# Patient Record
Sex: Male | Born: 1935 | Race: White | Hispanic: No | Marital: Married | Smoking: Former smoker
Health system: Southern US, Community
[De-identification: ages and names within clinical notes are randomized; demographics above are authoritative.]

## PROBLEM LIST (undated history)

## (undated) DIAGNOSIS — H409 Unspecified glaucoma: Secondary | ICD-10-CM

## (undated) DIAGNOSIS — H269 Unspecified cataract: Secondary | ICD-10-CM

## (undated) DIAGNOSIS — E785 Hyperlipidemia, unspecified: Secondary | ICD-10-CM

## (undated) DIAGNOSIS — E039 Hypothyroidism, unspecified: Secondary | ICD-10-CM

## (undated) DIAGNOSIS — I10 Essential (primary) hypertension: Secondary | ICD-10-CM

## (undated) DIAGNOSIS — H53009 Unspecified amblyopia, unspecified eye: Secondary | ICD-10-CM

## (undated) DIAGNOSIS — Z8673 Personal history of transient ischemic attack (TIA), and cerebral infarction without residual deficits: Secondary | ICD-10-CM

## (undated) DIAGNOSIS — G459 Transient cerebral ischemic attack, unspecified: Secondary | ICD-10-CM

## (undated) DIAGNOSIS — E079 Disorder of thyroid, unspecified: Secondary | ICD-10-CM

## (undated) DIAGNOSIS — J45909 Unspecified asthma, uncomplicated: Secondary | ICD-10-CM

## (undated) DIAGNOSIS — J449 Chronic obstructive pulmonary disease, unspecified: Secondary | ICD-10-CM

## (undated) DIAGNOSIS — H34231 Retinal artery branch occlusion, right eye: Secondary | ICD-10-CM

## (undated) DIAGNOSIS — G629 Polyneuropathy, unspecified: Secondary | ICD-10-CM

## (undated) DIAGNOSIS — E119 Type 2 diabetes mellitus without complications: Secondary | ICD-10-CM

## (undated) DIAGNOSIS — N429 Disorder of prostate, unspecified: Secondary | ICD-10-CM

## (undated) HISTORY — DX: Transient cerebral ischemic attack, unspecified: G45.9

## (undated) HISTORY — DX: Retinal artery branch occlusion, right eye: H34.231

## (undated) HISTORY — PX: TONSILLECTOMY: SUR1361

## (undated) HISTORY — PX: UMBILICAL HERNIA REPAIR: SHX196

## (undated) HISTORY — PX: HERNIA REPAIR: SHX51

## (undated) HISTORY — DX: Unspecified glaucoma: H40.9

## (undated) HISTORY — DX: Unspecified cataract: H26.9

## (undated) HISTORY — DX: Hyperlipidemia, unspecified: E78.5

## (undated) HISTORY — DX: Disorder of thyroid, unspecified: E07.9

## (undated) HISTORY — DX: Polyneuropathy, unspecified: G62.9

## (undated) HISTORY — DX: Unspecified asthma, uncomplicated: J45.909

## (undated) HISTORY — PX: PAROTIDECTOMY: SUR1003

## (undated) HISTORY — DX: Chronic obstructive pulmonary disease, unspecified: J44.9

## (undated) HISTORY — DX: Personal history of transient ischemic attack (TIA), and cerebral infarction without residual deficits: Z86.73

## (undated) HISTORY — DX: Disorder of prostate, unspecified: N42.9

## (undated) HISTORY — DX: Unspecified amblyopia, unspecified eye: H53.009

---

## 2002-09-11 HISTORY — PX: COLONOSCOPY: SHX174

## 2015-11-22 DIAGNOSIS — E119 Type 2 diabetes mellitus without complications: Secondary | ICD-10-CM | POA: Diagnosis not present

## 2015-11-22 DIAGNOSIS — H401132 Primary open-angle glaucoma, bilateral, moderate stage: Secondary | ICD-10-CM | POA: Diagnosis not present

## 2015-11-22 DIAGNOSIS — H2513 Age-related nuclear cataract, bilateral: Secondary | ICD-10-CM | POA: Diagnosis not present

## 2015-11-22 DIAGNOSIS — H34231 Retinal artery branch occlusion, right eye: Secondary | ICD-10-CM | POA: Diagnosis not present

## 2015-12-02 DIAGNOSIS — E785 Hyperlipidemia, unspecified: Secondary | ICD-10-CM | POA: Diagnosis not present

## 2015-12-02 DIAGNOSIS — I10 Essential (primary) hypertension: Secondary | ICD-10-CM | POA: Diagnosis not present

## 2015-12-02 DIAGNOSIS — J449 Chronic obstructive pulmonary disease, unspecified: Secondary | ICD-10-CM | POA: Diagnosis not present

## 2015-12-02 DIAGNOSIS — E039 Hypothyroidism, unspecified: Secondary | ICD-10-CM | POA: Diagnosis not present

## 2015-12-02 DIAGNOSIS — E1149 Type 2 diabetes mellitus with other diabetic neurological complication: Secondary | ICD-10-CM | POA: Diagnosis not present

## 2016-01-21 DIAGNOSIS — K921 Melena: Secondary | ICD-10-CM | POA: Diagnosis not present

## 2016-01-21 DIAGNOSIS — Z7984 Long term (current) use of oral hypoglycemic drugs: Secondary | ICD-10-CM | POA: Diagnosis not present

## 2016-01-21 DIAGNOSIS — J45909 Unspecified asthma, uncomplicated: Secondary | ICD-10-CM | POA: Diagnosis not present

## 2016-01-21 DIAGNOSIS — E119 Type 2 diabetes mellitus without complications: Secondary | ICD-10-CM | POA: Diagnosis not present

## 2016-01-21 DIAGNOSIS — R404 Transient alteration of awareness: Secondary | ICD-10-CM | POA: Diagnosis not present

## 2016-01-21 DIAGNOSIS — R55 Syncope and collapse: Secondary | ICD-10-CM | POA: Diagnosis not present

## 2016-01-21 DIAGNOSIS — R42 Dizziness and giddiness: Secondary | ICD-10-CM | POA: Diagnosis not present

## 2016-01-21 DIAGNOSIS — R197 Diarrhea, unspecified: Secondary | ICD-10-CM | POA: Diagnosis not present

## 2016-01-21 DIAGNOSIS — Z87891 Personal history of nicotine dependence: Secondary | ICD-10-CM | POA: Diagnosis not present

## 2016-01-21 DIAGNOSIS — Z86010 Personal history of colonic polyps: Secondary | ICD-10-CM | POA: Diagnosis not present

## 2016-01-21 DIAGNOSIS — K625 Hemorrhage of anus and rectum: Secondary | ICD-10-CM | POA: Diagnosis not present

## 2016-01-21 DIAGNOSIS — Z7902 Long term (current) use of antithrombotics/antiplatelets: Secondary | ICD-10-CM | POA: Diagnosis not present

## 2016-01-21 DIAGNOSIS — E1142 Type 2 diabetes mellitus with diabetic polyneuropathy: Secondary | ICD-10-CM | POA: Diagnosis not present

## 2016-01-21 DIAGNOSIS — D72829 Elevated white blood cell count, unspecified: Secondary | ICD-10-CM | POA: Diagnosis not present

## 2016-01-21 DIAGNOSIS — K5731 Diverticulosis of large intestine without perforation or abscess with bleeding: Secondary | ICD-10-CM | POA: Diagnosis not present

## 2016-01-21 DIAGNOSIS — I10 Essential (primary) hypertension: Secondary | ICD-10-CM | POA: Diagnosis not present

## 2016-01-21 DIAGNOSIS — D649 Anemia, unspecified: Secondary | ICD-10-CM | POA: Diagnosis not present

## 2016-01-21 DIAGNOSIS — Z8673 Personal history of transient ischemic attack (TIA), and cerebral infarction without residual deficits: Secondary | ICD-10-CM | POA: Diagnosis not present

## 2016-01-21 DIAGNOSIS — K222 Esophageal obstruction: Secondary | ICD-10-CM | POA: Diagnosis not present

## 2016-01-21 DIAGNOSIS — R531 Weakness: Secondary | ICD-10-CM | POA: Diagnosis not present

## 2016-01-21 DIAGNOSIS — Z79899 Other long term (current) drug therapy: Secondary | ICD-10-CM | POA: Diagnosis not present

## 2016-01-22 DIAGNOSIS — K921 Melena: Secondary | ICD-10-CM | POA: Diagnosis not present

## 2016-01-22 DIAGNOSIS — D123 Benign neoplasm of transverse colon: Secondary | ICD-10-CM | POA: Diagnosis not present

## 2016-01-22 DIAGNOSIS — D120 Benign neoplasm of cecum: Secondary | ICD-10-CM | POA: Diagnosis not present

## 2016-01-22 DIAGNOSIS — K222 Esophageal obstruction: Secondary | ICD-10-CM | POA: Diagnosis not present

## 2016-01-22 DIAGNOSIS — D122 Benign neoplasm of ascending colon: Secondary | ICD-10-CM | POA: Diagnosis not present

## 2016-01-22 DIAGNOSIS — K625 Hemorrhage of anus and rectum: Secondary | ICD-10-CM | POA: Diagnosis not present

## 2016-01-22 DIAGNOSIS — R55 Syncope and collapse: Secondary | ICD-10-CM | POA: Diagnosis not present

## 2016-01-22 DIAGNOSIS — R42 Dizziness and giddiness: Secondary | ICD-10-CM | POA: Diagnosis not present

## 2016-01-22 HISTORY — PX: COLONOSCOPY W/ BIOPSIES: SHX1374

## 2016-01-22 HISTORY — PX: UPPER GASTROINTESTINAL ENDOSCOPY: SHX188

## 2016-01-23 DIAGNOSIS — R42 Dizziness and giddiness: Secondary | ICD-10-CM | POA: Diagnosis not present

## 2016-01-23 DIAGNOSIS — K625 Hemorrhage of anus and rectum: Secondary | ICD-10-CM | POA: Diagnosis not present

## 2016-01-23 DIAGNOSIS — R55 Syncope and collapse: Secondary | ICD-10-CM | POA: Diagnosis not present

## 2016-02-02 DIAGNOSIS — Z09 Encounter for follow-up examination after completed treatment for conditions other than malignant neoplasm: Secondary | ICD-10-CM | POA: Diagnosis not present

## 2016-02-02 DIAGNOSIS — K625 Hemorrhage of anus and rectum: Secondary | ICD-10-CM | POA: Diagnosis not present

## 2016-02-09 DIAGNOSIS — Z8719 Personal history of other diseases of the digestive system: Secondary | ICD-10-CM | POA: Diagnosis not present

## 2016-02-09 DIAGNOSIS — Z86010 Personal history of colonic polyps: Secondary | ICD-10-CM | POA: Diagnosis not present

## 2016-03-07 DIAGNOSIS — E039 Hypothyroidism, unspecified: Secondary | ICD-10-CM | POA: Diagnosis not present

## 2016-03-07 DIAGNOSIS — I10 Essential (primary) hypertension: Secondary | ICD-10-CM | POA: Diagnosis not present

## 2016-03-07 DIAGNOSIS — E785 Hyperlipidemia, unspecified: Secondary | ICD-10-CM | POA: Diagnosis not present

## 2016-03-07 DIAGNOSIS — E1142 Type 2 diabetes mellitus with diabetic polyneuropathy: Secondary | ICD-10-CM | POA: Diagnosis not present

## 2016-03-30 DIAGNOSIS — H401132 Primary open-angle glaucoma, bilateral, moderate stage: Secondary | ICD-10-CM | POA: Diagnosis not present

## 2016-05-08 DIAGNOSIS — J40 Bronchitis, not specified as acute or chronic: Secondary | ICD-10-CM | POA: Diagnosis not present

## 2016-05-17 DIAGNOSIS — R05 Cough: Secondary | ICD-10-CM | POA: Diagnosis not present

## 2016-05-17 DIAGNOSIS — J42 Unspecified chronic bronchitis: Secondary | ICD-10-CM | POA: Diagnosis not present

## 2016-05-17 DIAGNOSIS — J9801 Acute bronchospasm: Secondary | ICD-10-CM | POA: Diagnosis not present

## 2016-05-17 DIAGNOSIS — J449 Chronic obstructive pulmonary disease, unspecified: Secondary | ICD-10-CM | POA: Diagnosis not present

## 2016-05-30 DIAGNOSIS — J449 Chronic obstructive pulmonary disease, unspecified: Secondary | ICD-10-CM | POA: Diagnosis not present

## 2016-05-30 DIAGNOSIS — Z23 Encounter for immunization: Secondary | ICD-10-CM | POA: Diagnosis not present

## 2016-06-06 DIAGNOSIS — S161XXA Strain of muscle, fascia and tendon at neck level, initial encounter: Secondary | ICD-10-CM | POA: Diagnosis not present

## 2016-08-10 DIAGNOSIS — H25012 Cortical age-related cataract, left eye: Secondary | ICD-10-CM | POA: Diagnosis not present

## 2016-08-10 DIAGNOSIS — H524 Presbyopia: Secondary | ICD-10-CM | POA: Diagnosis not present

## 2016-08-10 DIAGNOSIS — H5203 Hypermetropia, bilateral: Secondary | ICD-10-CM | POA: Diagnosis not present

## 2016-08-10 DIAGNOSIS — E119 Type 2 diabetes mellitus without complications: Secondary | ICD-10-CM | POA: Diagnosis not present

## 2016-08-10 DIAGNOSIS — H2513 Age-related nuclear cataract, bilateral: Secondary | ICD-10-CM | POA: Diagnosis not present

## 2016-08-10 DIAGNOSIS — H401132 Primary open-angle glaucoma, bilateral, moderate stage: Secondary | ICD-10-CM | POA: Diagnosis not present

## 2016-08-10 DIAGNOSIS — H52203 Unspecified astigmatism, bilateral: Secondary | ICD-10-CM | POA: Diagnosis not present

## 2016-08-29 DIAGNOSIS — E039 Hypothyroidism, unspecified: Secondary | ICD-10-CM | POA: Diagnosis not present

## 2016-08-29 DIAGNOSIS — I10 Essential (primary) hypertension: Secondary | ICD-10-CM | POA: Diagnosis not present

## 2016-08-29 DIAGNOSIS — R351 Nocturia: Secondary | ICD-10-CM | POA: Diagnosis not present

## 2016-08-29 DIAGNOSIS — N401 Enlarged prostate with lower urinary tract symptoms: Secondary | ICD-10-CM | POA: Diagnosis not present

## 2016-08-29 DIAGNOSIS — E1142 Type 2 diabetes mellitus with diabetic polyneuropathy: Secondary | ICD-10-CM | POA: Diagnosis not present

## 2016-08-29 DIAGNOSIS — E785 Hyperlipidemia, unspecified: Secondary | ICD-10-CM | POA: Diagnosis not present

## 2016-10-31 DIAGNOSIS — E785 Hyperlipidemia, unspecified: Secondary | ICD-10-CM | POA: Diagnosis not present

## 2016-12-06 DIAGNOSIS — E1142 Type 2 diabetes mellitus with diabetic polyneuropathy: Secondary | ICD-10-CM | POA: Diagnosis not present

## 2016-12-06 DIAGNOSIS — E039 Hypothyroidism, unspecified: Secondary | ICD-10-CM | POA: Diagnosis not present

## 2016-12-06 DIAGNOSIS — I10 Essential (primary) hypertension: Secondary | ICD-10-CM | POA: Diagnosis not present

## 2016-12-06 DIAGNOSIS — E785 Hyperlipidemia, unspecified: Secondary | ICD-10-CM | POA: Diagnosis not present

## 2017-01-08 DIAGNOSIS — H401132 Primary open-angle glaucoma, bilateral, moderate stage: Secondary | ICD-10-CM | POA: Diagnosis not present

## 2017-03-07 DIAGNOSIS — R42 Dizziness and giddiness: Secondary | ICD-10-CM | POA: Diagnosis not present

## 2017-03-12 DIAGNOSIS — I10 Essential (primary) hypertension: Secondary | ICD-10-CM | POA: Diagnosis not present

## 2017-03-12 DIAGNOSIS — E785 Hyperlipidemia, unspecified: Secondary | ICD-10-CM | POA: Diagnosis not present

## 2017-03-12 DIAGNOSIS — M2040 Other hammer toe(s) (acquired), unspecified foot: Secondary | ICD-10-CM | POA: Diagnosis not present

## 2017-03-12 DIAGNOSIS — E039 Hypothyroidism, unspecified: Secondary | ICD-10-CM | POA: Diagnosis not present

## 2017-03-12 DIAGNOSIS — E1142 Type 2 diabetes mellitus with diabetic polyneuropathy: Secondary | ICD-10-CM | POA: Diagnosis not present

## 2017-04-09 DIAGNOSIS — Z1283 Encounter for screening for malignant neoplasm of skin: Secondary | ICD-10-CM | POA: Diagnosis not present

## 2017-04-09 DIAGNOSIS — L820 Inflamed seborrheic keratosis: Secondary | ICD-10-CM | POA: Diagnosis not present

## 2017-04-09 DIAGNOSIS — L821 Other seborrheic keratosis: Secondary | ICD-10-CM | POA: Diagnosis not present

## 2017-06-21 DIAGNOSIS — E039 Hypothyroidism, unspecified: Secondary | ICD-10-CM | POA: Diagnosis not present

## 2017-06-21 DIAGNOSIS — E785 Hyperlipidemia, unspecified: Secondary | ICD-10-CM | POA: Diagnosis not present

## 2017-06-21 DIAGNOSIS — I10 Essential (primary) hypertension: Secondary | ICD-10-CM | POA: Diagnosis not present

## 2017-06-21 DIAGNOSIS — Z23 Encounter for immunization: Secondary | ICD-10-CM | POA: Diagnosis not present

## 2017-06-21 DIAGNOSIS — E119 Type 2 diabetes mellitus without complications: Secondary | ICD-10-CM | POA: Diagnosis not present

## 2017-07-10 DIAGNOSIS — E119 Type 2 diabetes mellitus without complications: Secondary | ICD-10-CM | POA: Diagnosis not present

## 2017-07-10 DIAGNOSIS — H401132 Primary open-angle glaucoma, bilateral, moderate stage: Secondary | ICD-10-CM | POA: Diagnosis not present

## 2017-07-10 DIAGNOSIS — Z7984 Long term (current) use of oral hypoglycemic drugs: Secondary | ICD-10-CM | POA: Diagnosis not present

## 2017-07-10 DIAGNOSIS — H2513 Age-related nuclear cataract, bilateral: Secondary | ICD-10-CM | POA: Diagnosis not present

## 2017-09-24 DIAGNOSIS — E785 Hyperlipidemia, unspecified: Secondary | ICD-10-CM | POA: Diagnosis not present

## 2017-09-24 DIAGNOSIS — E039 Hypothyroidism, unspecified: Secondary | ICD-10-CM | POA: Diagnosis not present

## 2017-09-24 DIAGNOSIS — E119 Type 2 diabetes mellitus without complications: Secondary | ICD-10-CM | POA: Diagnosis not present

## 2017-09-24 DIAGNOSIS — I10 Essential (primary) hypertension: Secondary | ICD-10-CM | POA: Diagnosis not present

## 2017-10-11 ENCOUNTER — Emergency Department (HOSPITAL_COMMUNITY): Payer: PPO

## 2017-10-11 ENCOUNTER — Other Ambulatory Visit: Payer: Self-pay

## 2017-10-11 ENCOUNTER — Inpatient Hospital Stay (HOSPITAL_COMMUNITY)
Admission: EM | Admit: 2017-10-11 | Discharge: 2017-10-15 | DRG: 470 | Disposition: A | Discharge status: Skilled Nursing Facility | Payer: PPO | Attending: Family Medicine | Admitting: Family Medicine

## 2017-10-11 DIAGNOSIS — Z96641 Presence of right artificial hip joint: Secondary | ICD-10-CM | POA: Diagnosis not present

## 2017-10-11 DIAGNOSIS — Z96642 Presence of left artificial hip joint: Secondary | ICD-10-CM | POA: Diagnosis present

## 2017-10-11 DIAGNOSIS — R410 Disorientation, unspecified: Secondary | ICD-10-CM | POA: Diagnosis not present

## 2017-10-11 DIAGNOSIS — S7001XA Contusion of right hip, initial encounter: Secondary | ICD-10-CM | POA: Diagnosis present

## 2017-10-11 DIAGNOSIS — M25551 Pain in right hip: Secondary | ICD-10-CM | POA: Diagnosis not present

## 2017-10-11 DIAGNOSIS — M6281 Muscle weakness (generalized): Secondary | ICD-10-CM | POA: Diagnosis not present

## 2017-10-11 DIAGNOSIS — Z833 Family history of diabetes mellitus: Secondary | ICD-10-CM | POA: Diagnosis not present

## 2017-10-11 DIAGNOSIS — S299XXA Unspecified injury of thorax, initial encounter: Secondary | ICD-10-CM | POA: Diagnosis not present

## 2017-10-11 DIAGNOSIS — E119 Type 2 diabetes mellitus without complications: Secondary | ICD-10-CM | POA: Diagnosis present

## 2017-10-11 DIAGNOSIS — Z7984 Long term (current) use of oral hypoglycemic drugs: Secondary | ICD-10-CM | POA: Diagnosis not present

## 2017-10-11 DIAGNOSIS — Z6830 Body mass index (BMI) 30.0-30.9, adult: Secondary | ICD-10-CM

## 2017-10-11 DIAGNOSIS — E1169 Type 2 diabetes mellitus with other specified complication: Secondary | ICD-10-CM | POA: Diagnosis not present

## 2017-10-11 DIAGNOSIS — D62 Acute posthemorrhagic anemia: Secondary | ICD-10-CM | POA: Diagnosis not present

## 2017-10-11 DIAGNOSIS — J449 Chronic obstructive pulmonary disease, unspecified: Secondary | ICD-10-CM | POA: Diagnosis present

## 2017-10-11 DIAGNOSIS — D72829 Elevated white blood cell count, unspecified: Secondary | ICD-10-CM | POA: Diagnosis present

## 2017-10-11 DIAGNOSIS — I444 Left anterior fascicular block: Secondary | ICD-10-CM | POA: Diagnosis present

## 2017-10-11 DIAGNOSIS — W109XXA Fall (on) (from) unspecified stairs and steps, initial encounter: Secondary | ICD-10-CM | POA: Diagnosis present

## 2017-10-11 DIAGNOSIS — Z79890 Hormone replacement therapy (postmenopausal): Secondary | ICD-10-CM | POA: Diagnosis not present

## 2017-10-11 DIAGNOSIS — S72001D Fracture of unspecified part of neck of right femur, subsequent encounter for closed fracture with routine healing: Secondary | ICD-10-CM | POA: Diagnosis not present

## 2017-10-11 DIAGNOSIS — Z7902 Long term (current) use of antithrombotics/antiplatelets: Secondary | ICD-10-CM

## 2017-10-11 DIAGNOSIS — I10 Essential (primary) hypertension: Secondary | ICD-10-CM | POA: Diagnosis present

## 2017-10-11 DIAGNOSIS — S72091A Other fracture of head and neck of right femur, initial encounter for closed fracture: Secondary | ICD-10-CM | POA: Diagnosis not present

## 2017-10-11 DIAGNOSIS — E785 Hyperlipidemia, unspecified: Secondary | ICD-10-CM | POA: Diagnosis present

## 2017-10-11 DIAGNOSIS — S72041A Displaced fracture of base of neck of right femur, initial encounter for closed fracture: Secondary | ICD-10-CM | POA: Diagnosis not present

## 2017-10-11 DIAGNOSIS — S72001A Fracture of unspecified part of neck of right femur, initial encounter for closed fracture: Secondary | ICD-10-CM | POA: Diagnosis present

## 2017-10-11 DIAGNOSIS — T148XXA Other injury of unspecified body region, initial encounter: Secondary | ICD-10-CM | POA: Diagnosis not present

## 2017-10-11 DIAGNOSIS — R52 Pain, unspecified: Secondary | ICD-10-CM

## 2017-10-11 DIAGNOSIS — Z8249 Family history of ischemic heart disease and other diseases of the circulatory system: Secondary | ICD-10-CM | POA: Diagnosis not present

## 2017-10-11 DIAGNOSIS — Z7951 Long term (current) use of inhaled steroids: Secondary | ICD-10-CM

## 2017-10-11 DIAGNOSIS — Z79899 Other long term (current) drug therapy: Secondary | ICD-10-CM

## 2017-10-11 DIAGNOSIS — Z9181 History of falling: Secondary | ICD-10-CM | POA: Diagnosis not present

## 2017-10-11 DIAGNOSIS — E669 Obesity, unspecified: Secondary | ICD-10-CM | POA: Diagnosis present

## 2017-10-11 DIAGNOSIS — E039 Hypothyroidism, unspecified: Secondary | ICD-10-CM | POA: Diagnosis present

## 2017-10-11 DIAGNOSIS — E7800 Pure hypercholesterolemia, unspecified: Secondary | ICD-10-CM | POA: Diagnosis present

## 2017-10-11 DIAGNOSIS — Z471 Aftercare following joint replacement surgery: Secondary | ICD-10-CM | POA: Diagnosis not present

## 2017-10-11 DIAGNOSIS — S79911A Unspecified injury of right hip, initial encounter: Secondary | ICD-10-CM | POA: Diagnosis not present

## 2017-10-11 DIAGNOSIS — Y92511 Restaurant or cafe as the place of occurrence of the external cause: Secondary | ICD-10-CM

## 2017-10-11 DIAGNOSIS — G8911 Acute pain due to trauma: Secondary | ICD-10-CM | POA: Diagnosis not present

## 2017-10-11 DIAGNOSIS — R488 Other symbolic dysfunctions: Secondary | ICD-10-CM | POA: Diagnosis not present

## 2017-10-11 DIAGNOSIS — R262 Difficulty in walking, not elsewhere classified: Secondary | ICD-10-CM | POA: Diagnosis not present

## 2017-10-11 DIAGNOSIS — S79919A Unspecified injury of unspecified hip, initial encounter: Secondary | ICD-10-CM | POA: Diagnosis not present

## 2017-10-11 HISTORY — DX: Essential (primary) hypertension: I10

## 2017-10-11 HISTORY — DX: Type 2 diabetes mellitus without complications: E11.9

## 2017-10-11 HISTORY — DX: Hypothyroidism, unspecified: E03.9

## 2017-10-11 LAB — CBC WITH DIFFERENTIAL/PLATELET
BASOS ABS: 0 10*3/uL (ref 0.0–0.1)
Basophils Relative: 0 %
Eosinophils Absolute: 0.1 10*3/uL (ref 0.0–0.7)
Eosinophils Relative: 0 %
HEMATOCRIT: 43.6 % (ref 39.0–52.0)
Hemoglobin: 15.1 g/dL (ref 13.0–17.0)
LYMPHS ABS: 0.7 10*3/uL (ref 0.7–4.0)
LYMPHS PCT: 5 %
MCH: 33 pg (ref 26.0–34.0)
MCHC: 34.6 g/dL (ref 30.0–36.0)
MCV: 95.4 fL (ref 78.0–100.0)
MONO ABS: 1.4 10*3/uL — AB (ref 0.1–1.0)
Monocytes Relative: 9 %
NEUTROS ABS: 14 10*3/uL — AB (ref 1.7–7.7)
Neutrophils Relative %: 86 %
Platelets: 242 10*3/uL (ref 150–400)
RBC: 4.57 MIL/uL (ref 4.22–5.81)
RDW: 13.2 % (ref 11.5–15.5)
WBC: 16.2 10*3/uL — ABNORMAL HIGH (ref 4.0–10.5)

## 2017-10-11 NOTE — ED Provider Notes (Signed)
[facility] Provider Note   CSN: [id number] Arrival date & time: 10/11/17  2145     History   Chief Complaint Chief Complaint  Patient presents with  . Fall  . Hip Pain    HPI Nathan Pennington is a 82 y.o. male with a hx of non-insulin-dependent diabetes, COPD, high cholesterol, hypertension, presents to the Emergency Department complaining of acute, persistent, progressively worsening right hip pain onset approximately 8 PM this evening.  Patient reports he was at a restaurant and missed one step falling forward.  He reports he caught himself with his hands but the rest of his weight landed on his right hip.  He reports he was able to get up with the assistance of a chair and has ambulated since that time however the pain in his right hip has worsened significantly over the last several hours.  He states by the time he arrived home, he was unable to ambulate on his right leg any longer.  He denies weakness or numbness in the leg.  He reports no pain at rest however any flexion, extension or rotation creates severe 10/10 pain.  He denies any previous orthopedic procedures does not currently see an orthopedist.  Patient does report he is taking Plavix but does not know why.  No treatments prior to arrival.  Reports he did not hit his head or have a loss of consciousness.  He has no back pain.  The history is provided by the patient and medical records. No language interpreter was used.    Past Medical History:  Diagnosis Date  . Diabetes mellitus without complication ([place])   . Hypertension   . Hypothyroidism     Patient Active Problem List   Diagnosis Date Noted  . Closed right hip fracture, initial encounter ([place]) 10/12/2017  . Diabetes mellitus type 2 in obese ([place]) 10/12/2017  . Hypothyroidism 10/12/2017  . Essential hypertension 10/12/2017  . HLD (hyperlipidemia) 10/12/2017     Home Medications    Prior to Admission medications     Medication Sig Start Date End Date Taking? Authorizing Provider  albuterol (PROVENTIL HFA) 108 (90 Base) MCG/ACT inhaler Inhale 2 puffs into the lungs every 6 (six) hours as needed for shortness of breath. 05/17/16  Yes [provider]  ALPHAGAN P 0.1 % SOLN Place 1 drop into both eyes daily. 09/06/17  Yes [provider]  budesonide-formoterol (SYMBICORT) 160-4.5 MCG/ACT inhaler Inhale 2 puffs into the lungs 2 (two) times daily. 04/02/17 06/28/18 Yes [provider]  clopidogrel (PLAVIX) 75 MG tablet Take 75 mg by mouth daily. 10/05/15  Yes [provider]  fenofibrate (TRICOR) 48 MG tablet Take 48 mg by mouth daily. 09/26/17  Yes [provider]  finasteride (PROSCAR) 5 MG tablet Take 5 mg by mouth daily. 10/06/17  Yes [provider]  Flaxseed, Linseed, (FLAX SEEDS PO) Take 1 capsule by mouth daily.   Yes [provider]  glipiZIDE (GLUCOTROL XL) 2.5 MG 24 hr tablet Take 2.5 mg by mouth daily. 08/24/15  Yes [provider]  latanoprost (XALATAN) 0.005 % ophthalmic solution Place 1 drop into both eyes at bedtime.   Yes [provider]  levothyroxine (SYNTHROID, LEVOTHROID) 75 MCG tablet Take 75 mcg by mouth daily. 10/31/16 10/31/17 Yes [provider]  lisinopril (PRINIVIL,ZESTRIL) 2.5 MG tablet Take 2.5 mg by mouth daily. 07/23/17  Yes [provider]  meclizine (ANTIVERT) 25 MG tablet Take 25 mg by mouth as  needed. Travel sickness 03/07/17  Yes [provider]  metFORMIN (GLUCOPHAGE) 1000 MG tablet Take 1,000 mg by mouth 2 (two) times daily. 08/20/17  Yes [provider]  omega-3 acid ethyl esters (LOVAZA) 1 g capsule Take 1 g by mouth 2 (two) times daily.   Yes [provider]  OVER THE COUNTER MEDICATION Take 1 capsule by mouth daily.   Yes [provider]  OVER THE COUNTER MEDICATION Take 1 capsule by mouth at bedtime.   Yes [provider]  OVER THE COUNTER  MEDICATION Take 1 Scoop by mouth daily.   Yes [provider]  rosuvastatin (CRESTOR) 20 MG tablet Take 20 mg by mouth daily. 09/03/17  Yes [provider]  tamsulosin (FLOMAX) 0.4 MG CAPS capsule Take 0.4 mg by mouth daily. 09/26/17  Yes [provider]    Family History Family History  Problem Relation Age of Onset  . Cancer Mother   . Diabetes Mellitus II Father   . Hypertension Father     Social History Social History   Tobacco Use  . Smoking status: Never Smoker  . Smokeless tobacco: Never Used  Substance Use Topics  . Alcohol use: No    Frequency: Never  . Drug use: No     Allergies   Patient has no known allergies.   Review of Systems Review of Systems  Constitutional: Negative for appetite change, diaphoresis, fatigue, fever and unexpected weight change.  HENT: Negative for mouth sores.   Eyes: Negative for visual disturbance.  Respiratory: Negative for cough, chest tightness, shortness of breath and wheezing.   Cardiovascular: Negative for chest pain.  Gastrointestinal: Negative for abdominal pain, constipation, diarrhea, nausea and vomiting.  Endocrine: Negative for polydipsia, polyphagia and polyuria.  Genitourinary: Negative for dysuria, frequency, hematuria and urgency.  Musculoskeletal: Positive for arthralgias. Negative for back pain and neck stiffness.  Skin: Negative for rash.  Allergic/Immunologic: Negative for immunocompromised state.  Neurological: Negative for syncope, light-headedness and headaches.  Hematological: Does not bruise/bleed easily.  Psychiatric/Behavioral: Negative for sleep disturbance. The patient is not nervous/anxious.      Physical Exam Updated Vital Signs BP (!) 152/64 (BP Location: Right Arm)   Pulse 91   Temp 97.6 F (36.4 C) (Oral)   Resp 16   Ht 6' (1.829 m)   Wt 103 kg (227 lb)   SpO2 98%   BMI 30.79 kg/m   Physical Exam  Constitutional: He appears well-developed and well-nourished.  No distress.  Awake, alert, nontoxic appearance  HENT:  Head: Normocephalic and atraumatic.  Mouth/Throat: Oropharynx is clear and moist. No oropharyngeal exudate.  Eyes: Conjunctivae are normal. No scleral icterus.  Neck: Normal range of motion. Neck supple.  Cardiovascular: Normal rate, regular rhythm and intact distal pulses.  Pulmonary/Chest: Effort normal and breath sounds normal. No respiratory distress. He has no wheezes.  Equal chest expansion  Abdominal: Soft. Bowel sounds are normal. He exhibits no mass. There is no tenderness. There is no rebound and no guarding.  Musculoskeletal: He exhibits no edema.  Minimal tenderness to palpation along the right trochanter. No range of motion of the right hip or knee.  Full range of motion of the right ankle and all toes of the right foot. No significant bruising noted. No TTP along the groin  Neurological: He is alert.  Speech is clear and goal oriented Moves extremities without ataxia Sensation intact to normal touch in the bilateral lower extremities Strength 5/5 with dorsiflexion and plantarflexion in the bilateral  lower extremities; strength not tested at the right knee or hip  Skin: Skin is warm and dry. He is not diaphoretic.  Psychiatric: He has a normal mood and affect.  Nursing note and vitals reviewed.    ED Treatments / Results  Labs (all labs ordered are listed, but only abnormal results are displayed) Labs Reviewed  CBC WITH DIFFERENTIAL/PLATELET - Abnormal; Notable for the following components:      Result Value   WBC 16.2 (*)    Neutro Abs 14.0 (*)    Monocytes Absolute 1.4 (*)    All other components within normal limits  COMPREHENSIVE METABOLIC PANEL - Abnormal; Notable for the following components:   Glucose, Bld 169 (*)    BUN 22 (*)    ALT 13 (*)    All other components within normal limits  GLUCOSE, CAPILLARY - Abnormal; Notable for the following components:   Glucose-Capillary 157 (*)    All other  components within normal limits  SURGICAL PCR SCREEN  PROTIME-INR  TYPE AND SCREEN  ABO/RH    EKG  EKG Interpretation  Date/Time:  Thursday October 11 2017 23:24:34 EST Ventricular Rate:  83 PR Interval:    QRS Duration: 117 QT Interval:  392 QTC Calculation: 461 R Axis:   -59 Text Interpretation:  Sinus rhythm Left anterior fascicular block Probable left ventricular hypertrophy Confirmed by Quintella Reichert [phone]) on 10/11/2017 11:34:01 PM       Radiology Dg Chest 2 View  Result Date: 10/11/2017 CLINICAL DATA:  82 y/o  M; status post fall.  Preop. EXAM: CHEST  2 VIEW COMPARISON:  None. FINDINGS: Normal cardiac silhouette. Aortic atherosclerosis with calcification. Clear lungs. No pleural effusion or pneumothorax. No acute osseous abnormality is evident. IMPRESSION: No acute pulmonary process identified. Electronically Signed   By: Kristine Garbe M.D.   On: 10/11/2017 23:27   Dg Hip Unilat  With Pelvis 2-3 Views Right  Result Date: 10/11/2017 CLINICAL DATA:  Generalized right hip pain after a fall. EXAM: DG HIP (WITH OR WITHOUT PELVIS) 2-3V RIGHT COMPARISON:  None. FINDINGS: Transverse fracture of the right femoral neck with varus angulation of the fracture fragments. No dislocation at the hip joint. Pelvis appears intact. Mild degenerative changes in the lower lumbar spine and both hips. IMPRESSION: Acute transverse fracture of the right femoral neck with varus angulation. Electronically Signed   By: Lucienne Capers M.D.   On: 10/11/2017 22:36    Procedures Procedures (including critical care time)  Medications Ordered in ED Medications  brimonidine (ALPHAGAN) 0.15 % ophthalmic solution 1 drop (not administered)  mometasone-formoterol (DULERA) 200-5 MCG/ACT inhaler 2 puff (2 puffs Inhalation Not Given 10/12/17 0156)  latanoprost (XALATAN) 0.005 % ophthalmic solution 1 drop (not administered)  morphine 2 MG/ML injection 0.5 mg (0.5 mg Intravenous Given 10/12/17 0339)    insulin aspart (novoLOG) injection 0-9 Units (2 Units Subcutaneous Given 10/12/17 0436)  hydrALAZINE (APRESOLINE) injection 10 mg (not administered)  levothyroxine (SYNTHROID, LEVOTHROID) injection 37.5 mcg (not administered)  albuterol (PROVENTIL) (2.5 MG/3ML) 0.083% nebulizer solution 2.5 mg (not administered)     Initial Impression / Assessment and Plan / ED Course  I have reviewed the triage vital signs and the nursing notes.  Pertinent labs & imaging results that were available during my care of the patient were reviewed by me and considered in my medical decision making (see chart for details).  Clinical Course as of Oct 13 611  Thu Oct 11, 2017  2356 Discussed with Dr. Berenice Primas who  will evaluate in the AM.  Pt to be admitted to the hospitalist.  [HM]  Fri Oct 12, 2017  0002 Discussed with Dr. Hal Hope who will admit  [HM]  0612 Leukocytosis WBC: (!) 16.2 [HM]    Clinical Course User Index [HM] Stepan Verrette, Jarrett Soho, PA-C    Patient with right femoral neck fracture with some varus angulation.  I personally evaluated the films.  Neurovascularly intact.  Mechanical fall, no syncope.  Preop chest x-ray and EKG completed.  Leukocytosis noted however patient is afebrile without signs of infection.  Patient also with hypertension however no chest pain or shortness of breath.  History of same.  Patient will need admission.  He is on Plavix chronically.  Discussed with orthopedics who will evaluate in the morning.  Pt will be admitted to the hospitalist.  The patient was discussed with and seen by Dr. Ralene Bathe who agrees with the treatment plan.   Final Clinical Impressions(s) / ED Diagnoses   Final diagnoses:  Closed fracture of right hip, initial encounter [facility])    ED Discharge Orders    None       Loni Muse Gwenlyn Perking 10/12/17 8657    Quintella Reichert, MD 10/12/17 [phone]

## 2017-10-11 NOTE — ED Notes (Signed)
Patient transported to X-ray 

## 2017-10-11 NOTE — ED Notes (Signed)
Bed: [facility] Expected date:  Expected time:  Means of arrival:  Comments: Hip pain/possible rotation

## 2017-10-11 NOTE — ED Notes (Addendum)
Patient states he took 1g tylenol before paramedics arrived.

## 2017-10-11 NOTE — ED Triage Notes (Signed)
Pt BIB EMS from home with complaints of R hip pain s/p fall at a restaurant in [place]. Patient made it home and crawled up stairs, having to be carried down stairs. Fall was mechanical, Patient mis-stepped off of the seating area. Patient is on plavix.

## 2017-10-12 ENCOUNTER — Inpatient Hospital Stay (HOSPITAL_COMMUNITY): Payer: PPO

## 2017-10-12 ENCOUNTER — Inpatient Hospital Stay (HOSPITAL_COMMUNITY): Payer: PPO | Admitting: Certified Registered Nurse Anesthetist

## 2017-10-12 ENCOUNTER — Encounter (HOSPITAL_COMMUNITY)
Admission: EM | Disposition: A | Discharge status: Skilled Nursing Facility | Payer: Self-pay | Source: Home / Self Care | Attending: Nephrology

## 2017-10-12 ENCOUNTER — Encounter (HOSPITAL_COMMUNITY): Payer: Self-pay | Admitting: Internal Medicine

## 2017-10-12 DIAGNOSIS — E785 Hyperlipidemia, unspecified: Secondary | ICD-10-CM | POA: Diagnosis present

## 2017-10-12 DIAGNOSIS — S72001A Fracture of unspecified part of neck of right femur, initial encounter for closed fracture: Secondary | ICD-10-CM | POA: Diagnosis present

## 2017-10-12 DIAGNOSIS — Z7951 Long term (current) use of inhaled steroids: Secondary | ICD-10-CM | POA: Diagnosis not present

## 2017-10-12 DIAGNOSIS — E669 Obesity, unspecified: Secondary | ICD-10-CM | POA: Diagnosis present

## 2017-10-12 DIAGNOSIS — D62 Acute posthemorrhagic anemia: Secondary | ICD-10-CM | POA: Diagnosis not present

## 2017-10-12 DIAGNOSIS — Z79899 Other long term (current) drug therapy: Secondary | ICD-10-CM | POA: Diagnosis not present

## 2017-10-12 DIAGNOSIS — R410 Disorientation, unspecified: Secondary | ICD-10-CM | POA: Diagnosis not present

## 2017-10-12 DIAGNOSIS — E119 Type 2 diabetes mellitus without complications: Secondary | ICD-10-CM | POA: Diagnosis present

## 2017-10-12 DIAGNOSIS — E1169 Type 2 diabetes mellitus with other specified complication: Secondary | ICD-10-CM | POA: Diagnosis present

## 2017-10-12 DIAGNOSIS — Y92511 Restaurant or cafe as the place of occurrence of the external cause: Secondary | ICD-10-CM | POA: Diagnosis not present

## 2017-10-12 DIAGNOSIS — J449 Chronic obstructive pulmonary disease, unspecified: Secondary | ICD-10-CM | POA: Diagnosis present

## 2017-10-12 DIAGNOSIS — I10 Essential (primary) hypertension: Secondary | ICD-10-CM | POA: Diagnosis present

## 2017-10-12 DIAGNOSIS — Z833 Family history of diabetes mellitus: Secondary | ICD-10-CM | POA: Diagnosis not present

## 2017-10-12 DIAGNOSIS — D72829 Elevated white blood cell count, unspecified: Secondary | ICD-10-CM | POA: Diagnosis present

## 2017-10-12 DIAGNOSIS — E039 Hypothyroidism, unspecified: Secondary | ICD-10-CM | POA: Diagnosis present

## 2017-10-12 DIAGNOSIS — S7001XA Contusion of right hip, initial encounter: Secondary | ICD-10-CM | POA: Diagnosis present

## 2017-10-12 DIAGNOSIS — Z6830 Body mass index (BMI) 30.0-30.9, adult: Secondary | ICD-10-CM | POA: Diagnosis not present

## 2017-10-12 DIAGNOSIS — W109XXA Fall (on) (from) unspecified stairs and steps, initial encounter: Secondary | ICD-10-CM | POA: Diagnosis present

## 2017-10-12 DIAGNOSIS — Z7984 Long term (current) use of oral hypoglycemic drugs: Secondary | ICD-10-CM | POA: Diagnosis not present

## 2017-10-12 DIAGNOSIS — Z8249 Family history of ischemic heart disease and other diseases of the circulatory system: Secondary | ICD-10-CM | POA: Diagnosis not present

## 2017-10-12 DIAGNOSIS — Z7902 Long term (current) use of antithrombotics/antiplatelets: Secondary | ICD-10-CM | POA: Diagnosis not present

## 2017-10-12 DIAGNOSIS — I444 Left anterior fascicular block: Secondary | ICD-10-CM | POA: Diagnosis present

## 2017-10-12 DIAGNOSIS — E7800 Pure hypercholesterolemia, unspecified: Secondary | ICD-10-CM | POA: Diagnosis present

## 2017-10-12 DIAGNOSIS — Z79890 Hormone replacement therapy (postmenopausal): Secondary | ICD-10-CM | POA: Diagnosis not present

## 2017-10-12 DIAGNOSIS — Z96642 Presence of left artificial hip joint: Secondary | ICD-10-CM | POA: Diagnosis present

## 2017-10-12 HISTORY — PX: TOTAL HIP ARTHROPLASTY: SHX124

## 2017-10-12 LAB — COMPREHENSIVE METABOLIC PANEL
ALK PHOS: 53 U/L (ref 38–126)
ALT: 13 U/L — ABNORMAL LOW (ref 17–63)
ANION GAP: 7 (ref 5–15)
AST: 20 U/L (ref 15–41)
Albumin: 3.9 g/dL (ref 3.5–5.0)
BUN: 22 mg/dL — ABNORMAL HIGH (ref 6–20)
CALCIUM: 8.9 mg/dL (ref 8.9–10.3)
CO2: 26 mmol/L (ref 22–32)
Chloride: 108 mmol/L (ref 101–111)
Creatinine, Ser: 1.03 mg/dL (ref 0.61–1.24)
GFR calc non Af Amer: >60 mL/min (ref >60)
Glucose, Bld: 169 mg/dL — ABNORMAL HIGH (ref 65–99)
Potassium: 3.7 mmol/L (ref 3.5–5.1)
SODIUM: 141 mmol/L (ref 135–145)
Total Bilirubin: 0.7 mg/dL (ref 0.3–1.2)
Total Protein: 6.8 g/dL (ref 6.5–8.1)

## 2017-10-12 LAB — GLUCOSE, CAPILLARY
GLUCOSE-CAPILLARY: 157 mg/dL — AB (ref 65–99)
GLUCOSE-CAPILLARY: 134 mg/dL — AB (ref 65–99)
Glucose-Capillary: 114 mg/dL — ABNORMAL HIGH (ref 65–99)
Glucose-Capillary: 135 mg/dL — ABNORMAL HIGH (ref 65–99)
Glucose-Capillary: 179 mg/dL — ABNORMAL HIGH (ref 65–99)
Glucose-Capillary: 211 mg/dL — ABNORMAL HIGH (ref 65–99)

## 2017-10-12 LAB — SURGICAL PCR SCREEN
MRSA, PCR: NEGATIVE
Staphylococcus aureus: NEGATIVE

## 2017-10-12 LAB — ABO/RH: ABO/RH(D): O POS

## 2017-10-12 LAB — TYPE AND SCREEN
ABO/RH(D): O POS
Antibody Screen: NEGATIVE

## 2017-10-12 LAB — PROTIME-INR
INR: 0.9
Prothrombin Time: 12.1 seconds (ref 11.4–15.2)

## 2017-10-12 SURGERY — ARTHROPLASTY, HIP, TOTAL, ANTERIOR APPROACH
Anesthesia: General | Site: Hip | Laterality: Right

## 2017-10-12 MED ORDER — MORPHINE SULFATE (PF) 2 MG/ML IV SOLN
0.5000 mg | INTRAVENOUS | Status: DC | PRN
  Administered 2017-10-12 (×3): 0.5 mg via INTRAVENOUS
  Filled 2017-10-12 (×3): qty 1

## 2017-10-12 MED ORDER — BISACODYL 5 MG PO TBEC
5.0000 mg | DELAYED_RELEASE_TABLET | Freq: Every day | ORAL | Status: DC | PRN
  Administered 2017-10-13 – 2017-10-15 (×2): 5 mg via ORAL
  Filled 2017-10-12 (×2): qty 1

## 2017-10-12 MED ORDER — METHOCARBAMOL 1000 MG/10ML IJ SOLN
500.0000 mg | Freq: Four times a day (QID) | INTRAVENOUS | Status: DC | PRN
  Administered 2017-10-13: 500 mg via INTRAVENOUS
  Filled 2017-10-12: qty 550

## 2017-10-12 MED ORDER — SODIUM CHLORIDE 0.9 % IJ SOLN
INTRAMUSCULAR | Status: AC
  Filled 2017-10-12: qty 10

## 2017-10-12 MED ORDER — POVIDONE-IODINE 10 % EX SWAB: 2.0000 "application " | Freq: Once | CUTANEOUS | Status: DC

## 2017-10-12 MED ORDER — ACETAMINOPHEN 650 MG RE SUPP: 650.0000 mg | Freq: Four times a day (QID) | RECTAL | Status: DC | PRN

## 2017-10-12 MED ORDER — DOCUSATE SODIUM 100 MG PO CAPS: 100.0000 mg | ORAL_CAPSULE | Freq: Two times a day (BID) | ORAL | 0 refills | Status: AC

## 2017-10-12 MED ORDER — DEXAMETHASONE SODIUM PHOSPHATE 10 MG/ML IJ SOLN
INTRAMUSCULAR | Status: DC | PRN
  Administered 2017-10-12: 5 mg via INTRAVENOUS

## 2017-10-12 MED ORDER — ASPIRIN EC 325 MG PO TBEC: 325.0000 mg | DELAYED_RELEASE_TABLET | Freq: Every day | ORAL | 0 refills | Status: DC

## 2017-10-12 MED ORDER — ZOLPIDEM TARTRATE 5 MG PO TABS
5.0000 mg | ORAL_TABLET | Freq: Every evening | ORAL | Status: DC | PRN
Start: 2017-10-12 — End: 2017-10-15

## 2017-10-12 MED ORDER — DOCUSATE SODIUM 100 MG PO CAPS
100.0000 mg | ORAL_CAPSULE | Freq: Two times a day (BID) | ORAL | Status: DC
  Administered 2017-10-12 – 2017-10-15 (×6): 100 mg via ORAL
  Filled 2017-10-12 (×6): qty 1

## 2017-10-12 MED ORDER — POLYETHYLENE GLYCOL 3350 17 G PO PACK
17.0000 g | PACK | Freq: Every day | ORAL | Status: DC | PRN
  Filled 2017-10-12: qty 1

## 2017-10-12 MED ORDER — BUPIVACAINE LIPOSOME 1.3 % IJ SUSP
20.0000 mL | Freq: Once | INTRAMUSCULAR | Status: DC
  Filled 2017-10-12: qty 20

## 2017-10-12 MED ORDER — ALBUTEROL SULFATE HFA 108 (90 BASE) MCG/ACT IN AERS: 2.0000 | INHALATION_SPRAY | Freq: Four times a day (QID) | RESPIRATORY_TRACT | Status: DC | PRN

## 2017-10-12 MED ORDER — SUCCINYLCHOLINE CHLORIDE 200 MG/10ML IV SOSY
PREFILLED_SYRINGE | INTRAVENOUS | Status: AC
  Filled 2017-10-12: qty 10

## 2017-10-12 MED ORDER — LIDOCAINE 2% (20 MG/ML) 5 ML SYRINGE
INTRAMUSCULAR | Status: AC
  Filled 2017-10-12: qty 5

## 2017-10-12 MED ORDER — OXYCODONE HCL 5 MG/5ML PO SOLN: 5.0000 mg | Freq: Once | ORAL | Status: DC | PRN

## 2017-10-12 MED ORDER — LATANOPROST 0.005 % OP SOLN
1.0000 [drp] | Freq: Every day | OPHTHALMIC | Status: DC
  Administered 2017-10-12 – 2017-10-14 (×3): 1 [drp] via OPHTHALMIC
  Filled 2017-10-12 (×2): qty 2.5

## 2017-10-12 MED ORDER — PHENYLEPHRINE 40 MCG/ML (10ML) SYRINGE FOR IV PUSH (FOR BLOOD PRESSURE SUPPORT)
PREFILLED_SYRINGE | INTRAVENOUS | Status: AC
  Filled 2017-10-12: qty 20

## 2017-10-12 MED ORDER — ETOMIDATE 2 MG/ML IV SOLN
INTRAVENOUS | Status: AC
  Filled 2017-10-12: qty 10

## 2017-10-12 MED ORDER — BRIMONIDINE TARTRATE 0.15 % OP SOLN
1.0000 [drp] | Freq: Every day | OPHTHALMIC | Status: DC
  Administered 2017-10-12 – 2017-10-15 (×4): 1 [drp] via OPHTHALMIC
  Filled 2017-10-12: qty 5

## 2017-10-12 MED ORDER — HYDROCODONE-ACETAMINOPHEN 5-325 MG PO TABS
1.0000 | ORAL_TABLET | Freq: Four times a day (QID) | ORAL | Status: DC | PRN
  Administered 2017-10-13 – 2017-10-15 (×4): 1 via ORAL
  Filled 2017-10-12 (×2): qty 1
  Filled 2017-10-12: qty 2
  Filled 2017-10-12: qty 1

## 2017-10-12 MED ORDER — METHOCARBAMOL 500 MG PO TABS
500.0000 mg | ORAL_TABLET | Freq: Four times a day (QID) | ORAL | Status: DC | PRN
  Administered 2017-10-15: 02:00:00 500 mg via ORAL
  Filled 2017-10-12: qty 1

## 2017-10-12 MED ORDER — TRANEXAMIC ACID 1000 MG/10ML IV SOLN
1000.0000 mg | INTRAVENOUS | Status: AC
  Administered 2017-10-12: 1000 mg via INTRAVENOUS
  Filled 2017-10-12: qty 10
  Filled 2017-10-12: qty 1100

## 2017-10-12 MED ORDER — HYDROMORPHONE HCL 2 MG/ML IJ SOLN
INTRAMUSCULAR | Status: AC
  Filled 2017-10-12: qty 1

## 2017-10-12 MED ORDER — METFORMIN HCL 500 MG PO TABS
1000.0000 mg | ORAL_TABLET | Freq: Two times a day (BID) | ORAL | Status: DC
  Administered 2017-10-13: 08:00:00 1000 mg via ORAL
  Filled 2017-10-12: qty 2

## 2017-10-12 MED ORDER — LACTATED RINGERS IV SOLN
INTRAVENOUS | Status: DC
  Administered 2017-10-12 (×2): via INTRAVENOUS

## 2017-10-12 MED ORDER — LISINOPRIL 5 MG PO TABS
2.5000 mg | ORAL_TABLET | Freq: Every day | ORAL | Status: DC
  Administered 2017-10-15: 2.5 mg via ORAL
  Filled 2017-10-12 (×2): qty 1

## 2017-10-12 MED ORDER — OXYCODONE HCL 5 MG PO TABS: 5.0000 mg | ORAL_TABLET | Freq: Once | ORAL | Status: DC | PRN

## 2017-10-12 MED ORDER — ALBUMIN HUMAN 5 % IV SOLN
INTRAVENOUS | Status: DC | PRN
  Administered 2017-10-12: 17:00:00 via INTRAVENOUS

## 2017-10-12 MED ORDER — TAMSULOSIN HCL 0.4 MG PO CAPS
0.4000 mg | ORAL_CAPSULE | Freq: Every day | ORAL | Status: DC
  Administered 2017-10-12 – 2017-10-15 (×4): 0.4 mg via ORAL
  Filled 2017-10-12 (×4): qty 1

## 2017-10-12 MED ORDER — FENTANYL CITRATE (PF) 250 MCG/5ML IJ SOLN
INTRAMUSCULAR | Status: DC | PRN
  Administered 2017-10-12 (×2): 50 ug via INTRAVENOUS
  Administered 2017-10-12: 100 ug via INTRAVENOUS

## 2017-10-12 MED ORDER — LEVOTHYROXINE SODIUM 100 MCG IV SOLR
37.5000 ug | Freq: Every day | INTRAVENOUS | Status: DC
  Administered 2017-10-12: 10:00:00 37.5 ug via INTRAVENOUS
  Filled 2017-10-12: qty 5

## 2017-10-12 MED ORDER — CEFAZOLIN SODIUM-DEXTROSE 2-4 GM/100ML-% IV SOLN
2.0000 g | INTRAVENOUS | Status: AC
  Administered 2017-10-12: 2 g via INTRAVENOUS
  Filled 2017-10-12: qty 100

## 2017-10-12 MED ORDER — PROPOFOL 10 MG/ML IV BOLUS
INTRAVENOUS | Status: AC
  Filled 2017-10-12: qty 40

## 2017-10-12 MED ORDER — ALBUTEROL SULFATE (2.5 MG/3ML) 0.083% IN NEBU: 2.5000 mg | INHALATION_SOLUTION | Freq: Four times a day (QID) | RESPIRATORY_TRACT | Status: DC | PRN

## 2017-10-12 MED ORDER — ONDANSETRON HCL 4 MG/2ML IJ SOLN: 4.0000 mg | Freq: Four times a day (QID) | INTRAMUSCULAR | Status: DC | PRN

## 2017-10-12 MED ORDER — LEVOTHYROXINE SODIUM 75 MCG PO TABS
75.0000 ug | ORAL_TABLET | Freq: Every day | ORAL | Status: DC
  Administered 2017-10-13 – 2017-10-15 (×3): 75 ug via ORAL
  Filled 2017-10-12 (×3): qty 1

## 2017-10-12 MED ORDER — HYDROMORPHONE HCL 1 MG/ML IJ SOLN
INTRAMUSCULAR | Status: DC | PRN
  Administered 2017-10-12 (×5): .4 mg via INTRAVENOUS

## 2017-10-12 MED ORDER — SODIUM CHLORIDE 0.9 % IR SOLN
Status: DC | PRN
  Administered 2017-10-12: 1000 mL

## 2017-10-12 MED ORDER — PHENYLEPHRINE 40 MCG/ML (10ML) SYRINGE FOR IV PUSH (FOR BLOOD PRESSURE SUPPORT)
PREFILLED_SYRINGE | INTRAVENOUS | Status: DC | PRN
  Administered 2017-10-12: 80 ug via INTRAVENOUS

## 2017-10-12 MED ORDER — FENTANYL CITRATE (PF) 100 MCG/2ML IJ SOLN
INTRAMUSCULAR | Status: AC
  Filled 2017-10-12: qty 2

## 2017-10-12 MED ORDER — TRANEXAMIC ACID 1000 MG/10ML IV SOLN
2000.0000 mg | Freq: Once | INTRAVENOUS | Status: AC
  Administered 2017-10-12: 2000 mg via TOPICAL
  Filled 2017-10-12: qty 20

## 2017-10-12 MED ORDER — LIDOCAINE 2% (20 MG/ML) 5 ML SYRINGE
INTRAMUSCULAR | Status: DC | PRN
  Administered 2017-10-12: 75 mg via INTRAVENOUS

## 2017-10-12 MED ORDER — ONDANSETRON HCL 4 MG/2ML IJ SOLN
INTRAMUSCULAR | Status: AC
  Filled 2017-10-12: qty 2

## 2017-10-12 MED ORDER — TRANEXAMIC ACID 1000 MG/10ML IV SOLN
1000.0000 mg | Freq: Once | INTRAVENOUS | Status: AC
  Administered 2017-10-12: 20:00:00 1000 mg via INTRAVENOUS
  Filled 2017-10-12: qty 1100

## 2017-10-12 MED ORDER — BUPIVACAINE HCL (PF) 0.25 % IJ SOLN
INTRAMUSCULAR | Status: AC
  Filled 2017-10-12: qty 30

## 2017-10-12 MED ORDER — MOMETASONE FURO-FORMOTEROL FUM 200-5 MCG/ACT IN AERO
2.0000 | INHALATION_SPRAY | Freq: Two times a day (BID) | RESPIRATORY_TRACT | Status: DC
  Administered 2017-10-12 – 2017-10-15 (×6): 2 via RESPIRATORY_TRACT
  Filled 2017-10-12: qty 8.8

## 2017-10-12 MED ORDER — SODIUM CHLORIDE 0.9 % IV SOLN
INTRAVENOUS | Status: DC
  Administered 2017-10-12 – 2017-10-13 (×3): via INTRAVENOUS

## 2017-10-12 MED ORDER — CLOPIDOGREL BISULFATE 75 MG PO TABS: 75.0000 mg | ORAL_TABLET | Freq: Every day | ORAL | Status: DC

## 2017-10-12 MED ORDER — FENTANYL CITRATE (PF) 100 MCG/2ML IJ SOLN: 25.0000 ug | INTRAMUSCULAR | Status: DC | PRN

## 2017-10-12 MED ORDER — HYDROCODONE-ACETAMINOPHEN 5-325 MG PO TABS: 1.0000 | ORAL_TABLET | Freq: Four times a day (QID) | ORAL | 0 refills | Status: DC | PRN

## 2017-10-12 MED ORDER — PROPOFOL 10 MG/ML IV BOLUS
INTRAVENOUS | Status: DC | PRN
  Administered 2017-10-12: 150 mg via INTRAVENOUS
  Administered 2017-10-12: 50 mg via INTRAVENOUS

## 2017-10-12 MED ORDER — ASPIRIN EC 325 MG PO TBEC
325.0000 mg | DELAYED_RELEASE_TABLET | Freq: Every day | ORAL | Status: DC
  Administered 2017-10-13 – 2017-10-15 (×3): 325 mg via ORAL
  Filled 2017-10-12 (×3): qty 1

## 2017-10-12 MED ORDER — ALBUMIN HUMAN 5 % IV SOLN
INTRAVENOUS | Status: AC
  Filled 2017-10-12: qty 250

## 2017-10-12 MED ORDER — SUCCINYLCHOLINE CHLORIDE 200 MG/10ML IV SOSY
PREFILLED_SYRINGE | INTRAVENOUS | Status: DC | PRN
  Administered 2017-10-12: 120 mg via INTRAVENOUS

## 2017-10-12 MED ORDER — ALUM & MAG HYDROXIDE-SIMETH 200-200-20 MG/5ML PO SUSP: 30.0000 mL | ORAL | Status: DC | PRN

## 2017-10-12 MED ORDER — ACETAMINOPHEN 325 MG PO TABS: 650.0000 mg | ORAL_TABLET | Freq: Four times a day (QID) | ORAL | Status: DC | PRN

## 2017-10-12 MED ORDER — ONDANSETRON HCL 4 MG PO TABS: 4.0000 mg | ORAL_TABLET | Freq: Four times a day (QID) | ORAL | Status: DC | PRN

## 2017-10-12 MED ORDER — INSULIN ASPART 100 UNIT/ML ~~LOC~~ SOLN
0.0000 [IU] | SUBCUTANEOUS | Status: DC
  Administered 2017-10-12: 3 [IU] via SUBCUTANEOUS
  Administered 2017-10-12: 12:00:00 1 [IU] via SUBCUTANEOUS
  Administered 2017-10-12: 05:00:00 2 [IU] via SUBCUTANEOUS
  Administered 2017-10-13: 3 [IU] via SUBCUTANEOUS
  Administered 2017-10-13: 04:00:00 2 [IU] via SUBCUTANEOUS
  Administered 2017-10-13: 08:00:00 3 [IU] via SUBCUTANEOUS

## 2017-10-12 MED ORDER — CHLORHEXIDINE GLUCONATE 4 % EX LIQD: 60.0000 mL | Freq: Once | CUTANEOUS | Status: DC

## 2017-10-12 MED ORDER — BUPIVACAINE LIPOSOME 1.3 % IJ SUSP
INTRAMUSCULAR | Status: DC | PRN
  Administered 2017-10-12: 20 mL

## 2017-10-12 MED ORDER — ONDANSETRON HCL 4 MG/2ML IJ SOLN
INTRAMUSCULAR | Status: DC | PRN
  Administered 2017-10-12: 4 mg via INTRAVENOUS

## 2017-10-12 MED ORDER — HYDRALAZINE HCL 20 MG/ML IJ SOLN: 10.0000 mg | INTRAMUSCULAR | Status: DC | PRN

## 2017-10-12 MED ORDER — DEXAMETHASONE SODIUM PHOSPHATE 10 MG/ML IJ SOLN
INTRAMUSCULAR | Status: AC
  Filled 2017-10-12: qty 1

## 2017-10-12 MED ORDER — BUPIVACAINE HCL (PF) 0.25 % IJ SOLN
INTRAMUSCULAR | Status: DC | PRN
  Administered 2017-10-12: 30 mL

## 2017-10-12 MED ORDER — CEFAZOLIN SODIUM-DEXTROSE 2-4 GM/100ML-% IV SOLN
2.0000 g | Freq: Four times a day (QID) | INTRAVENOUS | Status: AC
  Administered 2017-10-12 – 2017-10-13 (×2): 2 g via INTRAVENOUS
  Filled 2017-10-12 (×2): qty 100

## 2017-10-12 MED ORDER — TIZANIDINE HCL 2 MG PO TABS: 2.0000 mg | ORAL_TABLET | Freq: Three times a day (TID) | ORAL | 0 refills | Status: AC | PRN

## 2017-10-12 SURGICAL SUPPLY — 36 items
BAG ZIPLOCK 12X15 (MISCELLANEOUS) IMPLANT
BENZOIN TINCTURE PRP APPL 2/3 (GAUZE/BANDAGES/DRESSINGS) IMPLANT
BLADE SAW SGTL 18X1.27X75 (BLADE) ×2 IMPLANT
BLADE SAW SGTL 18X1.27X75MM (BLADE) ×1
BNDG COHESIVE 6X5 TAN STRL LF (GAUZE/BANDAGES/DRESSINGS) IMPLANT
CAPT HIP TOTAL 2 ×3 IMPLANT
CELLS DAT CNTRL 66122 CELL SVR (MISCELLANEOUS) IMPLANT
CLOSURE WOUND 1/2 X4 (GAUZE/BANDAGES/DRESSINGS)
COVER PERINEAL POST (MISCELLANEOUS) ×3 IMPLANT
COVER SURGICAL LIGHT HANDLE (MISCELLANEOUS) ×3 IMPLANT
DRAPE STERI IOBAN 125X83 (DRAPES) ×3 IMPLANT
DRAPE U-SHAPE 47X51 STRL (DRAPES) ×6 IMPLANT
DRSG AQUACEL AG ADV 3.5X10 (GAUZE/BANDAGES/DRESSINGS) ×3 IMPLANT
DURAPREP 26ML APPLICATOR (WOUND CARE) ×3 IMPLANT
ELECT REM PT RETURN 15FT ADLT (MISCELLANEOUS) ×3 IMPLANT
GAUZE XEROFORM 1X8 LF (GAUZE/BANDAGES/DRESSINGS) IMPLANT
GLOVE BIOGEL PI IND STRL 8 (GLOVE) ×2 IMPLANT
GLOVE BIOGEL PI INDICATOR 8 (GLOVE) ×4
GLOVE ECLIPSE 7.5 STRL STRAW (GLOVE) ×9 IMPLANT
GOWN STRL REUS W/TWL XL LVL3 (GOWN DISPOSABLE) ×6 IMPLANT
HOLDER FOLEY CATH W/STRAP (MISCELLANEOUS) IMPLANT
HOOD PEEL AWAY FLYTE STAYCOOL (MISCELLANEOUS) ×6 IMPLANT
PACK ANTERIOR HIP CUSTOM (KITS) ×3 IMPLANT
RTRCTR WOUND ALEXIS 18CM MED (MISCELLANEOUS)
STAPLER VISISTAT 35W (STAPLE) ×3 IMPLANT
STRIP CLOSURE SKIN 1/2X4 (GAUZE/BANDAGES/DRESSINGS) IMPLANT
SUT ETHIBOND NAB CT1 #1 30IN (SUTURE) ×6 IMPLANT
SUT MNCRL AB 3-0 PS2 18 (SUTURE) ×3 IMPLANT
SUT VIC AB 0 CT1 36 (SUTURE) ×3 IMPLANT
SUT VIC AB 0 CT2 27 (SUTURE) ×3 IMPLANT
SUT VIC AB 1 CT1 36 (SUTURE) ×6 IMPLANT
SUT VIC AB 2-0 CT1 27 (SUTURE) ×4
SUT VIC AB 2-0 CT1 TAPERPNT 27 (SUTURE) ×2 IMPLANT
SYRINGE 60CC LL (MISCELLANEOUS) ×3 IMPLANT
TRAY FOLEY W/METER SILVER 16FR (SET/KITS/TRAYS/PACK) IMPLANT
YANKAUER SUCT BULB TIP NO VENT (SUCTIONS) ×3 IMPLANT

## 2017-10-12 NOTE — Transfer of Care (Signed)
Immediate Anesthesia Transfer of Care Note  Patient: Nathan Pennington  Procedure(s) Performed: TOTAL HIP ARTHROPLASTY ANTERIOR APPROACH (Right Hip)  Patient Location: PACU  Anesthesia Type:General  Level of Consciousness: awake, sedated and responds to stimulation  Airway & Oxygen Therapy: Patient Spontanous Breathing and Patient connected to face mask oxygen  Post-op Assessment: Report given to RN and Post -op Vital signs reviewed and stable  Post vital signs: Reviewed and stable  Last Vitals:  Vitals:   10/12/17 0942 10/12/17 1318  BP: 132/64 129/62  Pulse: 74 79  Resp: 18 17  Temp: 36.7 C 36.7 C  SpO2: 98% 99%    Last Pain:  Vitals:   10/12/17 1500  TempSrc:   PainSc: 3       Patients Stated Pain Goal: 2 (20/94/70 9628)  Complications: No apparent anesthesia complications

## 2017-10-12 NOTE — Progress Notes (Signed)
[facility] Progress Note  Subjective: pt not in room, at OR for hip fracture surgery...  Vitals:   10/12/17 0730 10/12/17 0836 10/12/17 0942 10/12/17 1318  BP:   132/64 129/62  Pulse: 75  74 79  Resp: 17  18 17   Temp:   98 F (36.7 C) 98.1 F (36.7 C)  TempSrc:   Oral Oral  SpO2: 96% 98% 98% 99%  Weight:      Height:        Inpatient medications: . [MAR Hold] brimonidine  1 drop Both Eyes Daily  . [MAR Hold] bupivacaine liposome  20 mL Infiltration Once  . chlorhexidine  60 mL Topical Once  . [MAR Hold] insulin aspart  0-9 Units Subcutaneous Q4H  . [MAR Hold] latanoprost  1 drop Both Eyes QHS  . [MAR Hold] levothyroxine  37.5 mcg Intravenous Daily  . [MAR Hold] mometasone-formoterol  2 puff Inhalation BID  . povidone-iodine  2 application Topical Once    [MAR Hold] albuterol, bupivacaine (PF), bupivacaine liposome, [MAR Hold] hydrALAZINE, [MAR Hold]  morphine injection, sodium chloride irrigation  Exam: Pt not in room (at surgery)...    Brief Summary: Nathan Pennington is a 82 y.o. male with history of hypertension, hypothyroidism, diabetes mellitus presents to the ER with complaints of persistent right hip pain after patient had a fall last night after having dinner.  Patient states he had gone with his family for dinner celebrating his birthday when he tripped and fell.  Denies hitting his head or losing consciousness.  Denies any chest pain or shortness of breath or palpitations.  He went home following the fall but since patient's pain was worsening he came to the ER.  In the ER x-rays revealed right hip fracture and Dr. Berenice Primas on call orthopedic surgeon was consulted.  Patient will be admitted for surgery.  On exam patient is not in distress denies any chest pain or shortness of breath.  Assessment/Plan Principal Problem:   Closed right hip fracture, initial encounter [facility]) Active Problems:   Diabetes mellitus type 2 in obese ([place])   Hypothyroidism  Essential hypertension   HLD (hyperlipidemia)   Assessment/ Plan:  1. Right hip fracture status post mechanical fall - moderate risk for intermediate risk procedure. In OR now/ Friday, per orthopedics. Have reviewed chart and d/w staff caring for patient.  Patient took IV pain meds today w/o difficutly or complication. Has been NPO since admit overnight. Patient is on Plavix and patient states he never had a stroke or CAD.  Patient states he takes Plavix just for preventive measure.  2. Diabetes mellitus type 2  -we will hold oral hypoglycemics and keep patient on sliding scale coverage.  BS's are very good. After patient's surgery will consider restarting home meds.  3. Hypertension -since patient is n.p.o. I have kept patient on PRN IV hydralazine. 4. Hypothyroidism -we will dose Synthroid IV until patient can take orally. 5. Hist of L THA   DVT prophylaxis: SCDs. Code Status: Full code. Family Communication: Discussed with patient. Disposition Plan: Skilled nursing facility likely. Consults called: Orthopedics. Admission status: Inpatient.    Kelly Splinter MD [facility] pgr [phone] 10/12/2017, 5:30 PM   Recent Labs  Lab 10/11/17 2327  NA 141  K 3.7  CL 108  CO2 26  GLUCOSE 169*  BUN 22*  CREATININE 1.03  CALCIUM 8.9   Recent Labs  Lab 10/11/17 2327  AST 20  ALT 13*  ALKPHOS 53  BILITOT  0.7  PROT 6.8  ALBUMIN 3.9   Recent Labs  Lab 10/11/17 2327  WBC 16.2*  NEUTROABS 14.0*  HGB 15.1  HCT 43.6  MCV 95.4  PLT 242   Iron/TIBC/Ferritin/ %Sat No results found for: IRON, TIBC, FERRITIN, IRONPCTSAT

## 2017-10-12 NOTE — Anesthesia Procedure Notes (Signed)
Procedure Name: Intubation Date/Time: 10/12/2017 3:38 PM Performed by: Lollie Sails, CRNA Pre-anesthesia Checklist: Patient identified, Emergency Drugs available, Suction available, Patient being monitored and Timeout performed Patient Re-evaluated:Patient Re-evaluated prior to induction Oxygen Delivery Method: Circle system utilized Preoxygenation: Pre-oxygenation with 100% oxygen Induction Type: IV induction Ventilation: Two handed mask ventilation required Laryngoscope Size: Miller and 3 Grade View: Grade I Tube type: Oral Tube size: 8.0 mm Number of attempts: 1 Airway Equipment and Method: Stylet Placement Confirmation: ETT inserted through vocal cords under direct vision,  positive ETCO2 and breath sounds checked- equal and bilateral Secured at: 23 cm Tube secured with: Tape Dental Injury: Teeth and Oropharynx as per pre-operative assessment

## 2017-10-12 NOTE — Anesthesia Preprocedure Evaluation (Signed)
Anesthesia Evaluation  Patient identified by MRN, date of birth, ID band Patient awake    Reviewed: Allergy & Precautions, NPO status , Patient's Chart, lab work & pertinent test results  History of Anesthesia Complications Negative for: history of anesthetic complications  Airway Mallampati: II  TM Distance: >3 FB Neck ROM: Full    Dental  (+) Teeth Intact   Pulmonary neg pulmonary ROS,    breath sounds clear to auscultation       Cardiovascular hypertension, Pt. on medications (-) angina(-) Past MI and (-) CHF  Rhythm:Regular     Neuro/Psych negative neurological ROS  negative psych ROS   GI/Hepatic negative GI ROS, Neg liver ROS,   Endo/Other  diabetes, Type 2, Insulin DependentHypothyroidism   Renal/GU negative Renal ROS     Musculoskeletal Right hip fx   Abdominal   Peds  Hematology On plavix   Anesthesia Other Findings   Reproductive/Obstetrics                             Anesthesia Physical Anesthesia Plan  ASA: II  Anesthesia Plan: General   Post-op Pain Management:    Induction: Intravenous  PONV Risk Score and Plan: 2  Airway Management Planned: Oral ETT  Additional Equipment: None  Intra-op Plan:   Post-operative Plan: Extubation in OR  Informed Consent: I have reviewed the patients History and Physical, chart, labs and discussed the procedure including the risks, benefits and alternatives for the proposed anesthesia with the patient or authorized representative who has indicated his/her understanding and acceptance.   Dental advisory given  Plan Discussed with: CRNA and Surgeon  Anesthesia Plan Comments:         Anesthesia Quick Evaluation

## 2017-10-12 NOTE — Progress Notes (Signed)
Dr Lissa Hoard aware of BP in pacu. No orders received. May tx to room.

## 2017-10-12 NOTE — Brief Op Note (Signed)
10/11/2017 - 10/12/2017  5:34 PM  PATIENT:  Nathan Pennington  82 y.o. male  PRE-OPERATIVE DIAGNOSIS:  Right femoral neck fracture  POST-OPERATIVE DIAGNOSIS:  fractured right hip  PROCEDURE:  Procedure(s): TOTAL HIP ARTHROPLASTY ANTERIOR APPROACH (Right)  SURGEON:  Surgeon(s) and Role:    Dorna Leitz, MD - Primary  PHYSICIAN ASSISTANT:   ASSISTANTS: bethune   ANESTHESIA:   general  EBL:  800 mL   BLOOD ADMINISTERED:none  DRAINS: none   LOCAL MEDICATIONS USED:  MARCAINE    and OTHER experel  SPECIMEN:  No Specimen  DISPOSITION OF SPECIMEN:  N/A  COUNTS:  YES  TOURNIQUET:  * No tourniquets in log *  DICTATION: .Other Dictation: Dictation Number [id number]  PLAN OF CARE: Admit to inpatient   PATIENT DISPOSITION:  PACU - hemodynamically stable.   Delay start of Pharmacological VTE agent (>24hrs) due to surgical blood loss or risk of bleeding: no

## 2017-10-12 NOTE — Discharge Instructions (Signed)

## 2017-10-12 NOTE — Consult Note (Signed)
Reason for Consult:*r hip pain Referring Physician: hospitalists  Nathan Pennington is an 82 y.o. male.  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  Xrays showed a femoral neck fracture and we are consulted for management. He is admitted to medicine and will be cleared for surgery.  Past Medical History:  Diagnosis Date  . Diabetes mellitus without complication ([place])   . Hypertension   . Hypothyroidism     Past Surgical History:  Procedure Laterality Date  . HERNIA REPAIR    . TONSILLECTOMY      Family History  Problem Relation Age of Onset  . Cancer Mother   . Diabetes Mellitus II Father   . Hypertension Father     Social History:  reports that  has never smoked. he has never used smokeless tobacco. He reports that he does not drink alcohol or use drugs.  Allergies: No Known Allergies  Medications: I have reviewed the patient's current medications.  Results for orders placed or performed during the hospital encounter of 10/11/17 (from the past 48 hour(s))  CBC with Differential     Status: Abnormal   Collection Time: 10/11/17 11:27 PM  Result Value Ref Range   WBC 16.2 (H) 4.0 - 10.5 K/uL   RBC 4.57 4.22 - 5.81 MIL/uL   Hemoglobin 15.1 13.0 - 17.0 g/dL   HCT 43.6 39.0 - 52.0 %   MCV 95.4 78.0 - 100.0 fL   MCH 33.0 26.0 - 34.0 pg   MCHC 34.6 30.0 - 36.0 g/dL   RDW 13.2 11.5 - 15.5 %   Platelets 242 150 - 400 K/uL   Neutrophils Relative % 86 %   Neutro Abs 14.0 (H) 1.7 - 7.7 K/uL   Lymphocytes Relative 5 %   Lymphs Abs 0.7 0.7 - 4.0 K/uL   Monocytes Relative 9 %   Monocytes Absolute 1.4 (H) 0.1 - 1.0 K/uL   Eosinophils Relative 0 %   Eosinophils Absolute 0.1 0.0 - 0.7 K/uL   Basophils Relative 0 %   Basophils Absolute 0.0 0.0 - 0.1 K/uL  Comprehensive metabolic panel     Status: Abnormal   Collection Time: 10/11/17 11:27 PM  Result Value Ref Range   Sodium 141 135 - 145 mmol/L   Potassium 3.7  3.5 - 5.1 mmol/L   Chloride 108 101 - 111 mmol/L   CO2 26 22 - 32 mmol/L   Glucose, Bld 169 (H) 65 - 99 mg/dL   BUN 22 (H) 6 - 20 mg/dL   Creatinine, Ser 1.03 0.61 - 1.24 mg/dL   Calcium 8.9 8.9 - 10.3 mg/dL   Total Protein 6.8 6.5 - 8.1 g/dL   Albumin 3.9 3.5 - 5.0 g/dL   AST 20 15 - 41 U/L   ALT 13 (L) 17 - 63 U/L   Alkaline Phosphatase 53 38 - 126 U/L   Total Bilirubin 0.7 0.3 - 1.2 mg/dL   GFR calc non Af Amer >60 >60 mL/min   GFR calc Af Amer >60 >60 mL/min    Comment: (NOTE) The eGFR has been calculated using the CKD EPI equation. This calculation has not been validated in all clinical situations. eGFR's persistently <60 mL/min signify possible Chronic Kidney Disease.    Anion gap 7 5 - 15  Protime-INR     Status: None   Collection Time: 10/11/17 11:27 PM  Result Value Ref Range   Prothrombin Time 12.1  11.4 - 15.2 seconds   INR 0.90   Type and screen [place]     Status: None   Collection Time: 10/11/17 11:28 PM  Result Value Ref Range   ABO/RH(D) O POS    Antibody Screen NEG    Sample Expiration 10/14/2017   Glucose, capillary     Status: Abnormal   Collection Time: 10/12/17  4:02 AM  Result Value Ref Range   Glucose-Capillary 157 (H) 65 - 99 mg/dL    Dg Chest 2 View  Result Date: 10/11/2017 CLINICAL DATA:  82 y/o  M; status post fall.  Preop. EXAM: CHEST  2 VIEW COMPARISON:  None. FINDINGS: Normal cardiac silhouette. Aortic atherosclerosis with calcification. Clear lungs. No pleural effusion or pneumothorax. No acute osseous abnormality is evident. IMPRESSION: No acute pulmonary process identified. Electronically Signed   By: Kristine Garbe M.D.   On: 10/11/2017 23:27   Dg Hip Unilat  With Pelvis 2-3 Views Right  Result Date: 10/11/2017 CLINICAL DATA:  Generalized right hip pain after a fall. EXAM: DG HIP (WITH OR WITHOUT PELVIS) 2-3V RIGHT COMPARISON:  None. FINDINGS: Transverse fracture of the right femoral neck with varus  angulation of the fracture fragments. No dislocation at the hip joint. Pelvis appears intact. Mild degenerative changes in the lower lumbar spine and both hips. IMPRESSION: Acute transverse fracture of the right femoral neck with varus angulation. Electronically Signed   By: Lucienne Capers M.D.   On: 10/11/2017 22:36    ROS  ROS: I have reviewed the patient's review of systems thoroughly and there are no positive responses as relates to the HPI. Blood pressure (!) 156/73, pulse 75, temperature 98.4 F (36.9 C), temperature source Oral, resp. rate 15, height 6' (1.829 m), weight 103 kg (227 lb), SpO2 97 %. Physical Exam Well-developed well-nourished patient in no acute distress. Alert and oriented x3 HEENT:within normal limits Cardiac: Regular rate and rhythm Pulmonary: Lungs clear to auscultation Abdomen: Soft and nontender.  Normal active bowel sounds  Musculoskeletal: (r hip: externally rotated and shortened Assessment/Plan: 82 yo male with fall suffering a displaced r fem neck fracture.// Pt is admitted to internal medicine and will be taken to the OR for r total hip arthroplasty when OR available.    The patient is well aware of the risks of surgery including but not limited to bleeding, infection, need for further surgery and death in and around the time of surgery.  He understands the risks and wishes to proceed.    [name] 10/12/2017, 6:34 AM

## 2017-10-12 NOTE — ED Notes (Signed)
ED TO INPATIENT HANDOFF REPORT  Name/Age/Gender Nathan Pennington 82 y.o. male  Code Status    Code Status Orders  (From admission, onward)        Start     Ordered   10/12/17 0057  Full code  Continuous     10/12/17 0100    Code Status History    Date Active Date Inactive Code Status Order ID Comments User Context   This patient has a current code status but no historical code status.      Home/SNF/Other Home  Chief Complaint Fall; Hip pain  Level of Care/Admitting Diagnosis ED Disposition    ED Disposition Condition Comment   Admit  Hospital Area: [place] [[id number]]  Level of Care: Med-Surg [16]  Diagnosis: Closed right hip fracture, initial encounter [facility]) [562130]  Admitting Physician: Rise Patience [phone]  Attending Physician: Rise Patience [phone]  Estimated length of stay: past midnight tomorrow  Certification:: I certify this patient will need inpatient services for at least 2 midnights  PT Class (Do Not Modify): Inpatient [101]  PT Acc Code (Do Not Modify): Private [1]       Medical History Past Medical History:  Diagnosis Date  . Diabetes mellitus without complication ([place])   . Hypertension   . Hypothyroidism     Allergies No Known Allergies  IV Location/Drains/Wounds Patient Lines/Drains/Airways Status   Active Line/Drains/Airways    Name:   Placement date:   Placement time:   Site:   Days:   Peripheral IV 10/11/17 Right;Posterior;Lateral Forearm   10/11/17    2325    Forearm   1          Labs/Imaging Results for orders placed or performed during the hospital encounter of 10/11/17 (from the past 48 hour(s))  CBC with Differential     Status: Abnormal   Collection Time: 10/11/17 11:27 PM  Result Value Ref Range   WBC 16.2 (H) 4.0 - 10.5 K/uL   RBC 4.57 4.22 - 5.81 MIL/uL   Hemoglobin 15.1 13.0 - 17.0 g/dL   HCT 43.6 39.0 - 52.0 %   MCV 95.4 78.0 - 100.0 fL   MCH 33.0 26.0 - 34.0 pg   MCHC 34.6  30.0 - 36.0 g/dL   RDW 13.2 11.5 - 15.5 %   Platelets 242 150 - 400 K/uL   Neutrophils Relative % 86 %   Neutro Abs 14.0 (H) 1.7 - 7.7 K/uL   Lymphocytes Relative 5 %   Lymphs Abs 0.7 0.7 - 4.0 K/uL   Monocytes Relative 9 %   Monocytes Absolute 1.4 (H) 0.1 - 1.0 K/uL   Eosinophils Relative 0 %   Eosinophils Absolute 0.1 0.0 - 0.7 K/uL   Basophils Relative 0 %   Basophils Absolute 0.0 0.0 - 0.1 K/uL  Comprehensive metabolic panel     Status: Abnormal   Collection Time: 10/11/17 11:27 PM  Result Value Ref Range   Sodium 141 135 - 145 mmol/L   Potassium 3.7 3.5 - 5.1 mmol/L   Chloride 108 101 - 111 mmol/L   CO2 26 22 - 32 mmol/L   Glucose, Bld 169 (H) 65 - 99 mg/dL   BUN 22 (H) 6 - 20 mg/dL   Creatinine, Ser 1.03 0.61 - 1.24 mg/dL   Calcium 8.9 8.9 - 10.3 mg/dL   Total Protein 6.8 6.5 - 8.1 g/dL   Albumin 3.9 3.5 - 5.0 g/dL   AST 20 15 - 41 U/L  ALT 13 (L) 17 - 63 U/L   Alkaline Phosphatase 53 38 - 126 U/L   Total Bilirubin 0.7 0.3 - 1.2 mg/dL   GFR calc non Af Amer >60 >60 mL/min   GFR calc Af Amer >60 >60 mL/min    Comment: (NOTE) The eGFR has been calculated using the CKD EPI equation. This calculation has not been validated in all clinical situations. eGFR's persistently <60 mL/min signify possible Chronic Kidney Disease.    Anion gap 7 5 - 15  Protime-INR     Status: None   Collection Time: 10/11/17 11:27 PM  Result Value Ref Range   Prothrombin Time 12.1 11.4 - 15.2 seconds   INR 0.90   Type and screen [place]     Status: None   Collection Time: 10/11/17 11:28 PM  Result Value Ref Range   ABO/RH(D) O POS    Antibody Screen NEG    Sample Expiration 10/14/2017    Dg Chest 2 View  Result Date: 10/11/2017 CLINICAL DATA:  82 y/o  M; status post fall.  Preop. EXAM: CHEST  2 VIEW COMPARISON:  None. FINDINGS: Normal cardiac silhouette. Aortic atherosclerosis with calcification. Clear lungs. No pleural effusion or pneumothorax. No acute osseous  abnormality is evident. IMPRESSION: No acute pulmonary process identified. Electronically Signed   By: Kristine Garbe M.D.   On: 10/11/2017 23:27   Dg Hip Unilat  With Pelvis 2-3 Views Right  Result Date: 10/11/2017 CLINICAL DATA:  Generalized right hip pain after a fall. EXAM: DG HIP (WITH OR WITHOUT PELVIS) 2-3V RIGHT COMPARISON:  None. FINDINGS: Transverse fracture of the right femoral neck with varus angulation of the fracture fragments. No dislocation at the hip joint. Pelvis appears intact. Mild degenerative changes in the lower lumbar spine and both hips. IMPRESSION: Acute transverse fracture of the right femoral neck with varus angulation. Electronically Signed   By: Lucienne Capers M.D.   On: 10/11/2017 22:36    Pending Labs Unresulted Labs (From admission, onward)   Start     Ordered   10/11/17 2328  ABO/Rh  Once,   R     10/11/17 2328      Vitals/Pain Today's Vitals   10/12/17 0055 10/12/17 0100 10/12/17 0130 10/12/17 0200  BP: (!) 153/66 (!) 153/99 (!) 164/67 (!) 148/58  Pulse: 93 79 84 80  Resp: 17 17 19 19   Temp:      TempSrc:      SpO2: 99% 98% 97% 96%  Weight:      Height:      PainSc:        Isolation Precautions No active isolations  Medications Medications  brimonidine (ALPHAGAN) 0.15 % ophthalmic solution 1 drop (not administered)  mometasone-formoterol (DULERA) 200-5 MCG/ACT inhaler 2 puff (2 puffs Inhalation Not Given 10/12/17 0156)  latanoprost (XALATAN) 0.005 % ophthalmic solution 1 drop (not administered)  morphine 2 MG/ML injection 0.5 mg (not administered)  insulin aspart (novoLOG) injection 0-9 Units (not administered)  hydrALAZINE (APRESOLINE) injection 10 mg (not administered)  levothyroxine (SYNTHROID, LEVOTHROID) injection 37.5 mcg (not administered)  albuterol (PROVENTIL) (2.5 MG/3ML) 0.083% nebulizer solution 2.5 mg (not administered)    Mobility walks

## 2017-10-12 NOTE — H&P (Signed)
History and Physical    Nathan Pennington FBP:[id number] DOB: [birth year] DOA: 10/11/2017  PCP: No primary care provider on file.  Patient coming from: Home.  Chief Complaint: Fall.  HPI: Nathan Pennington is a 82 y.o. male with history of hypertension, hypothyroidism, diabetes mellitus presents to the ER with complaints of persistent right hip pain after patient had a fall last night after having dinner.  Patient states he had gone with his family for dinner celebrating his birthday when he tripped and fell.  Denies hitting his head or losing consciousness.  Denies any chest pain or shortness of breath or palpitations.  He went home following the fall but since patient's pain was worsening he came to the ER.  ED Course: In the ER x-rays revealed right hip fracture and Dr. Berenice Primas on call orthopedic surgeon was consulted.  Patient will be admitted for surgery.  On exam patient is not in distress denies any chest pain or shortness of breath.  Review of Systems: As per HPI, rest all negative.   Past Medical History:  Diagnosis Date  . Diabetes mellitus without complication ([place])   . Hypertension   . Hypothyroidism     Past Surgical History:  Procedure Laterality Date  . HERNIA REPAIR    . TONSILLECTOMY       reports that  has never smoked. he has never used smokeless tobacco. He reports that he does not drink alcohol or use drugs.  No Known Allergies  Family History  Problem Relation Age of Onset  . Cancer Mother   . Diabetes Mellitus II Father   . Hypertension Father     Prior to Admission medications   Medication Sig Start Date End Date Taking? Authorizing Provider  albuterol (PROVENTIL HFA) 108 (90 Base) MCG/ACT inhaler Inhale 2 puffs into the lungs every 6 (six) hours as needed for shortness of breath. 05/17/16  Yes [provider]  ALPHAGAN P 0.1 % SOLN Place 1 drop into both eyes daily. 09/06/17  Yes [provider]  budesonide-formoterol  (SYMBICORT) 160-4.5 MCG/ACT inhaler Inhale 2 puffs into the lungs 2 (two) times daily. 04/02/17 06/28/18 Yes [provider]  clopidogrel (PLAVIX) 75 MG tablet Take 75 mg by mouth daily. 10/05/15  Yes [provider]  fenofibrate (TRICOR) 48 MG tablet Take 48 mg by mouth daily. 09/26/17  Yes [provider]  finasteride (PROSCAR) 5 MG tablet Take 5 mg by mouth daily. 10/06/17  Yes [provider]  Flaxseed, Linseed, (FLAX SEEDS PO) Take 1 capsule by mouth daily.   Yes [provider]  glipiZIDE (GLUCOTROL XL) 2.5 MG 24 hr tablet Take 2.5 mg by mouth daily. 08/24/15  Yes [provider]  latanoprost (XALATAN) 0.005 % ophthalmic solution Place 1 drop into both eyes at bedtime.   Yes [provider]  levothyroxine (SYNTHROID, LEVOTHROID) 75 MCG tablet Take 75 mcg by mouth daily. 10/31/16 10/31/17 Yes [provider]  lisinopril (PRINIVIL,ZESTRIL) 2.5 MG tablet Take 2.5 mg by mouth daily. 07/23/17  Yes [provider]  meclizine (ANTIVERT) 25 MG tablet Take 25 mg by mouth as needed. Travel sickness 03/07/17  Yes [provider]  metFORMIN (GLUCOPHAGE) 1000 MG tablet Take 1,000 mg by mouth 2 (two) times daily. 08/20/17  Yes [provider]  omega-3 acid ethyl esters (LOVAZA) 1 g capsule Take 1 g by mouth 2 (two) times daily.   Yes [provider]  OVER THE COUNTER MEDICATION Take 1 capsule by  mouth daily.   Yes [provider]  OVER THE COUNTER MEDICATION Take 1 capsule by mouth at bedtime.   Yes [provider]  OVER THE COUNTER MEDICATION Take 1 Scoop by mouth daily.   Yes [provider]  rosuvastatin (CRESTOR) 20 MG tablet Take 20 mg by mouth daily. 09/03/17  Yes [provider]  tamsulosin (FLOMAX) 0.4 MG CAPS capsule Take 0.4 mg by mouth daily. 09/26/17  Yes [provider]    Physical Exam: Vitals:   10/11/17 2201 10/11/17 2304 10/12/17 0055  BP: (!)  152/64 (!) 151/61 (!) 153/66  Pulse: 91 83 93  Resp: 16 16 17   Temp: 97.6 F (36.4 C)    TempSrc: Oral    SpO2: 98% 95% 99%  Weight: 103 kg (227 lb)    Height: 6' (1.829 m)        Constitutional: Moderately built and nourished. Vitals:   10/11/17 2201 10/11/17 2304 10/12/17 0055  BP: (!) 152/64 (!) 151/61 (!) 153/66  Pulse: 91 83 93  Resp: 16 16 17   Temp: 97.6 F (36.4 C)    TempSrc: Oral    SpO2: 98% 95% 99%  Weight: 103 kg (227 lb)    Height: 6' (1.829 m)     Eyes: Anicteric no pallor. ENMT: No discharge from the ears eyes nose or mouth. Neck: No mass felt.  No neck rigidity. Respiratory: No rhonchi or crepitations. Cardiovascular: S1-S2 heard no murmurs appreciated. Abdomen: Soft nontender bowel sounds present. Musculoskeletal: Pain on moving right hip. Skin: No rash. Neurologic: Alert awake oriented to time place and person.  Moves all extremities. Psychiatric: Appears normal.  Normal affect.   Labs on Admission: I have personally reviewed following labs and imaging studies  CBC: Recent Labs  Lab 10/11/17 2327  WBC 16.2*  NEUTROABS 14.0*  HGB 15.1  HCT 43.6  MCV 95.4  PLT 024   Basic Metabolic Panel: Recent Labs  Lab 10/11/17 2327  NA 141  K 3.7  CL 108  CO2 26  GLUCOSE 169*  BUN 22*  CREATININE 1.03  CALCIUM 8.9   GFR: Estimated Creatinine Clearance: 71 mL/min (by C-G formula based on SCr of 1.03 mg/dL). Liver Function Tests: Recent Labs  Lab 10/11/17 2327  AST 20  ALT 13*  ALKPHOS 53  BILITOT 0.7  PROT 6.8  ALBUMIN 3.9   No results for input(s): LIPASE, AMYLASE in the last 168 hours. No results for input(s): AMMONIA in the last 168 hours. Coagulation Profile: Recent Labs  Lab 10/11/17 2327  INR 0.90   Cardiac Enzymes: No results for input(s): CKTOTAL, CKMB, CKMBINDEX, TROPONINI in the last 168 hours. BNP (last 3 results) No results for input(s): PROBNP in the last 8760 hours. HbA1C: No results for input(s): HGBA1C in the  last 72 hours. CBG: No results for input(s): GLUCAP in the last 168 hours. Lipid Profile: No results for input(s): CHOL, HDL, LDLCALC, TRIG, CHOLHDL, LDLDIRECT in the last 72 hours. Thyroid Function Tests: No results for input(s): TSH, T4TOTAL, FREET4, T3FREE, THYROIDAB in the last 72 hours. Anemia Panel: No results for input(s): VITAMINB12, FOLATE, FERRITIN, TIBC, IRON, RETICCTPCT in the last 72 hours. Urine analysis: No results found for: COLORURINE, APPEARANCEUR, LABSPEC, PHURINE, GLUCOSEU, HGBUR, BILIRUBINUR, KETONESUR, PROTEINUR, UROBILINOGEN, NITRITE, LEUKOCYTESUR Sepsis Labs: @LABRCNTIP (procalcitonin:4,lacticidven:4) )No results found for this or any previous visit (from the past 240 hour(s)).   Radiological Exams on Admission: Dg Chest 2 View  Result Date: 10/11/2017 CLINICAL DATA:  82 y/o  M; status  post fall.  Preop. EXAM: CHEST  2 VIEW COMPARISON:  None. FINDINGS: Normal cardiac silhouette. Aortic atherosclerosis with calcification. Clear lungs. No pleural effusion or pneumothorax. No acute osseous abnormality is evident. IMPRESSION: No acute pulmonary process identified. Electronically Signed   By: Kristine Garbe M.D.   On: 10/11/2017 23:27   Dg Hip Unilat  With Pelvis 2-3 Views Right  Result Date: 10/11/2017 CLINICAL DATA:  Generalized right hip pain after a fall. EXAM: DG HIP (WITH OR WITHOUT PELVIS) 2-3V RIGHT COMPARISON:  None. FINDINGS: Transverse fracture of the right femoral neck with varus angulation of the fracture fragments. No dislocation at the hip joint. Pelvis appears intact. Mild degenerative changes in the lower lumbar spine and both hips. IMPRESSION: Acute transverse fracture of the right femoral neck with varus angulation. Electronically Signed   By: Lucienne Capers M.D.   On: 10/11/2017 22:36    EKG: Independently reviewed.  Normal sinus rhythm with LVH.  Assessment/Plan Principal Problem:   Closed right hip fracture, initial encounter  [facility]) Active Problems:   Diabetes mellitus type 2 in obese ([place])   Hypothyroidism   Essential hypertension   HLD (hyperlipidemia)    1. Right hip fracture status post mechanical fall -patient at this time denies any chest pain or shortness of breath.  Patient will be at moderate risk for intermediate risk procedure.  Patient will be kept n.p.o. in anticipation of right hip surgery.  Dr. Berenice Primas aware. 2. Diabetes mellitus type 2  -we will hold oral hypoglycemics and keep patient on sliding scale coverage.  After patient's surgery will consider restarting. 3. Hypertension -since patient is n.p.o. I have kept patient on PRN IV hydralazine. 4. Hypothyroidism -we will dose Synthroid IV until patient can take orally.  Patient is on Plavix and patient states he never had a stroke or CAD.  Patient states he takes Plavix just for preventive measure.   DVT prophylaxis: SCDs. Code Status: Full code. Family Communication: Discussed with patient. Disposition Plan: Skilled nursing facility likely. Consults called: Orthopedics. Admission status: Inpatient.   Rise Patience MD [facility] Pager [phone].  If 7PM-7AM, please contact night-coverage [email or web address] Password [email or web address]  10/12/2017, 1:01 AM

## 2017-10-12 NOTE — Progress Notes (Signed)
Nutrition Brief Note  Patient identified for consult per Hip Fracture Protocol.   Wt Readings from Last 15 Encounters:  10/11/17 227 lb (103 kg)    Body mass index is 30.79 kg/m. Patient meets criteria for obesity based on current BMI. Per Care Everywhere review, pt weighed 234 lbs on 03/12/17 at [facility] and 233 lbs on 09/24/17 at [facility]. This indicates 6 lb weight loss (2.5% body weight) in 2.5 weeks which is not significant for time frame.   Pt s/p mechanical fall with associated R hip fx pending surgery.Current diet order is NPO. Medications reviewed; sliding scale Novolog, 37.5 mcg IV Synthroid/day. Labs reviewed; CBGs: 157 and 114 mg/dL this AM.  No nutrition interventions warranted at this time. If nutrition issues arise, please consult RD.      Jarome Matin, MS, RD, LDN, [facility] Inpatient Clinical Dietitian Pager # [phone] After hours/weekend pager # [phone]

## 2017-10-13 ENCOUNTER — Inpatient Hospital Stay (HOSPITAL_COMMUNITY): Payer: PPO

## 2017-10-13 LAB — BASIC METABOLIC PANEL
Anion gap: 8 (ref 5–15)
BUN: 12 mg/dL (ref 6–20)
CHLORIDE: 103 mmol/L (ref 101–111)
CO2: 25 mmol/L (ref 22–32)
CREATININE: 0.87 mg/dL (ref 0.61–1.24)
Calcium: 8 mg/dL — ABNORMAL LOW (ref 8.9–10.3)
GFR calc Af Amer: >60 mL/min (ref >60)
GFR calc non Af Amer: >60 mL/min (ref >60)
Glucose, Bld: 192 mg/dL — ABNORMAL HIGH (ref 65–99)
POTASSIUM: 3.8 mmol/L (ref 3.5–5.1)
Sodium: 136 mmol/L (ref 135–145)

## 2017-10-13 LAB — CBC
HCT: 32.9 % — ABNORMAL LOW (ref 39.0–52.0)
Hemoglobin: 11.2 g/dL — ABNORMAL LOW (ref 13.0–17.0)
MCH: 32.7 pg (ref 26.0–34.0)
MCHC: 34 g/dL (ref 30.0–36.0)
MCV: 95.9 fL (ref 78.0–100.0)
Platelets: 143 10*3/uL — ABNORMAL LOW (ref 150–400)
RBC: 3.43 MIL/uL — ABNORMAL LOW (ref 4.22–5.81)
RDW: 13.4 % (ref 11.5–15.5)
WBC: 13.7 10*3/uL — AB (ref 4.0–10.5)

## 2017-10-13 LAB — GLUCOSE, CAPILLARY
GLUCOSE-CAPILLARY: 212 mg/dL — AB (ref 65–99)
Glucose-Capillary: 149 mg/dL — ABNORMAL HIGH (ref 65–99)
Glucose-Capillary: 176 mg/dL — ABNORMAL HIGH (ref 65–99)
Glucose-Capillary: 199 mg/dL — ABNORMAL HIGH (ref 65–99)
Glucose-Capillary: 226 mg/dL — ABNORMAL HIGH (ref 65–99)
Glucose-Capillary: 249 mg/dL — ABNORMAL HIGH (ref 65–99)

## 2017-10-13 MED ORDER — INSULIN ASPART 100 UNIT/ML ~~LOC~~ SOLN
0.0000 [IU] | SUBCUTANEOUS | Status: DC
  Administered 2017-10-13: 17:00:00 5 [IU] via SUBCUTANEOUS
  Administered 2017-10-13: 2 [IU] via SUBCUTANEOUS
  Administered 2017-10-13: 3 [IU] via SUBCUTANEOUS
  Administered 2017-10-14: 2 [IU] via SUBCUTANEOUS
  Administered 2017-10-14 (×2): 3 [IU] via SUBCUTANEOUS
  Administered 2017-10-14: 5 [IU] via SUBCUTANEOUS
  Administered 2017-10-14: 2 [IU] via SUBCUTANEOUS
  Administered 2017-10-14: 13:00:00 3 [IU] via SUBCUTANEOUS
  Administered 2017-10-15: 05:00:00 2 [IU] via SUBCUTANEOUS
  Administered 2017-10-15: 3 [IU] via SUBCUTANEOUS
  Administered 2017-10-15 (×2): 2 [IU] via SUBCUTANEOUS

## 2017-10-13 MED ORDER — MORPHINE SULFATE (PF) 2 MG/ML IV SOLN: 0.5000 mg | INTRAVENOUS | Status: DC | PRN

## 2017-10-13 NOTE — Anesthesia Postprocedure Evaluation (Signed)
Anesthesia Post Note  Patient: Nathan Pennington  Procedure(s) Performed: TOTAL HIP ARTHROPLASTY ANTERIOR APPROACH (Right Hip)     Patient location during evaluation: PACU Anesthesia Type: General Level of consciousness: awake and alert Pain management: pain level controlled Vital Signs Assessment: post-procedure vital signs reviewed and stable Respiratory status: spontaneous breathing, nonlabored ventilation, respiratory function stable and patient connected to nasal cannula oxygen Cardiovascular status: blood pressure returned to baseline and stable Postop Assessment: no apparent nausea or vomiting Anesthetic complications: no    Last Vitals:  Vitals:   10/13/17 0144 10/13/17 0541  BP: (!) 140/59 (!) 121/54  Pulse: 80 86  Resp: 20 20  Temp: 36.4 C   SpO2: 98% 99%    Last Pain:  Vitals:   10/13/17 0635  TempSrc:   PainSc: 3                  Tenesia Escudero

## 2017-10-13 NOTE — Progress Notes (Signed)
[facility] Progress Note  Subjective: had surg yesterday afternoon.  Concern of position of R leg, getting f/u xray this morning per ortho  Vitals:   10/13/17 0144 10/13/17 0541 10/13/17 0827 10/13/17 0829  BP: (!) 140/59 (!) 121/54    Pulse: 80 86    Resp: 20 20    Temp: 97.6 F (36.4 C)     TempSrc:      SpO2: 98% 99% 94% 94%  Weight:      Height:        Inpatient medications: . aspirin EC  325 mg Oral Q breakfast  . brimonidine  1 drop Both Eyes Daily  . docusate sodium  100 mg Oral BID  . insulin aspart  0-9 Units Subcutaneous Q4H  . latanoprost  1 drop Both Eyes QHS  . levothyroxine  75 mcg Oral QAC breakfast  . lisinopril  2.5 mg Oral Daily  . mometasone-formoterol  2 puff Inhalation BID  . tamsulosin  0.4 mg Oral Daily   . sodium chloride 75 mL/hr at 10/12/17 2128  . methocarbamol (ROBAXIN)  IV Stopped (10/13/17 0055)   acetaminophen **OR** acetaminophen, alum & mag hydroxide-simeth, bisacodyl, hydrALAZINE, HYDROcodone-acetaminophen, methocarbamol **OR** methocarbamol (ROBAXIN)  IV, morphine injection, ondansetron **OR** ondansetron (ZOFRAN) IV, polyethylene glycol, zolpidem  Home meds: -glipizide 2.5mg  xl qd/ metformin 1 gm bid -lisinopril 2.5 qd -tricor/ crestor 20/ plavix 75 qd -flomax 0.4/ synthroid 75 ug/ proscar 5 mg -symbicort bid/ alb hfa prn -eyedrops/ prn antivert/ supplements    Exam: Alert, hoh, no distress No jvd Chest clear bilat RRR no mrg Abd soft ntnd Ext R hip w/ bruising, no LE edema NF, ox 3    Brief Summary: Epic Tribbett is a 82 y.o. male with history of hypertension, hypothyroidism, diabetes mellitus presented to the ER with complaints of persistent right hip pain after patient had a fall after having dinner.  Patient stated he had gone with his family for dinner celebrating his birthday when he tripped and fell.  Denied hitting his head or losing consciousness.  He came to the ER where x-rays revealed right hip  fracture and Dr. Berenice Primas on call orthopedic surgeon was consulted.  Patient was admitted.   Assessment/Plan Principal Problem:   Closed right hip fracture, initial encounter [facility]) Active Problems:   Diabetes mellitus type 2 in obese ([place])   Hypothyroidism   Essential hypertension   HLD (hyperlipidemia)   Assessment/ Plan:  1. Right hip fracture sp mech fall: sp ORIF/ R THA on 2/1 by Dr Berenice Primas.  This am is getting additional xrays out of concern for pt's leg position.  Will make npo and hold Plavix again until ortho decides on plan.  Pt is on Plavix and patient states he never had a stroke or CAD.  Patient states he takes Plavix just for preventive measure.  2. Diabetes mellitus type 2  - we are holding oral hypoglycemics , using SSI for now. BS"s are up a bit, will ^ SSI to moderate every 4 hrs. Will resume home meds soon.  3. Hypertension - resumed po lisinopril and cont prn hydralazline IV 4. Hypothyroidism - resumed po T4 5. Hist of L THA   DVT prophylaxis: SCDs. Code Status: Full code. Family Communication: Discussed with patient. Disposition Plan: Skilled nursing facility likely. Consults called: Orthopedics. Admission status: Inpatient.    Kelly Splinter MD [facility] pgr [phone] 10/13/2017, 9:25 AM   Recent Labs  Lab 10/11/17 2327  NA 141  K 3.7  CL 108  CO2 26  GLUCOSE 169*  BUN 22*  CREATININE 1.03  CALCIUM 8.9   Recent Labs  Lab 10/11/17 2327  AST 20  ALT 13*  ALKPHOS 53  BILITOT 0.7  PROT 6.8  ALBUMIN 3.9   Recent Labs  Lab 10/11/17 2327 10/13/17 0607  WBC 16.2* 13.7*  NEUTROABS 14.0*  --   HGB 15.1 11.2*  HCT 43.6 32.9*  MCV 95.4 95.9  PLT 242 143*   Iron/TIBC/Ferritin/ %Sat No results found for: IRON, TIBC, FERRITIN, IRONPCTSAT

## 2017-10-13 NOTE — Progress Notes (Signed)
Physical Therapy Treatment Patient Details Name: Nathan Pennington MRN: [id number] DOB: 1936/04/29 Today's Date: 10/13/2017    History of Present Illness 82 y.o. s/p Rt anterior THA 10/12/2017 due to fall was out to dinner with his family and fell at [place], drove home and crawled into the house) with fracture 10/10/2017 . PMH includes DM, HTN, and hypothyroidism.    PT Comments    Pt tolerated up in recliner for about 45 minutes, and needed assistance to go to 3n1 from recliner then back to bed. Pt transferred sit to stand to RW with +2 help with RW, at [facility] , improved ability than prior today, and pivoted with MinA +2 for safety to 3n1. Pt doing a little better this afternoon with this transfer. Nurse was going to assist pt off 3n1 back to bed when complete.     Follow Up Recommendations  SNF(unless progresses. Pt would like to go home )     Equipment Recommendations  Rolling walker with 5" wheels(tall one)    Recommendations for Other Services       Precautions / Restrictions Precautions Precautions: Fall Restrictions Weight Bearing Restrictions: Yes RLE Weight Bearing: Weight bearing as tolerated                Time: 3013-1438 PT Time Calculation (min) (ACUTE ONLY): 12 min  Charges:   $Therapeutic Activity: 8-22 mins                    G Codes:       Clide Dales, PT Pager: [phone] 10/13/2017    Chandler Stofer, Gatha Mayer 10/13/2017, 5:28 PM

## 2017-10-13 NOTE — Evaluation (Signed)
Occupational Therapy Evaluation Patient Details Name: Nathan Pennington MRN: [id number] DOB: Nov 08, 1935 Today's Date: 10/13/2017    History of Present Illness 82 y.o. s/p Rt anterior THA 10/12/2017 due to fall was out to dinner with his family and fell at [place], drove home and crawled into the house) with fracture 10/10/2017 . PMH includes DM, HTN, and hypothyroidism.   Clinical Impression   Pt s/p above. Pt independent with ADLs, PTA. Feel pt will benefit from acute OT to increase independence prior to d/c. Recommending SNF at this time.     Follow Up Recommendations  SNF;Supervision/Assistance - 24 hour    Equipment Recommendations  Other (comment)(defer to next venue)    Recommendations for Other Services       Precautions / Restrictions Precautions Precautions: Fall Restrictions Weight Bearing Restrictions: Yes RLE Weight Bearing: Weight bearing as tolerated      Mobility Bed Mobility Overal bed mobility: Needs Assistance Bed Mobility: Supine to Sit     Supine to sit: +2 for physical assistance;Mod assist     General bed mobility comments: assist with RLE and trunk.   Transfers Overall transfer level: Needs assistance Equipment used: Rolling walker (2 wheeled) Transfers: Sit to/from [facility] Sit to Stand: +2 physical assistance;Max assist Stand pivot transfers: +2 physical assistance;Max assist       General transfer comment: heavy assist to stand. Pt did better on second attempt. Pivoted to chair.     Balance    Assist from therapists and RW for stand pivot transfer to chair.                                       ADL either performed or assessed with clinical judgement   ADL Overall ADL's : Needs assistance/impaired                     Lower Body Dressing: +2 for physical assistance;Sit to/from stand;Total assistance   Toilet Transfer: Maximal assistance;+2 for physical assistance;Stand-pivot;RW(from bed  to chair)           Functional mobility during ADLs: Maximal assistance;+2 for physical assistance;Rolling walker General ADL Comments: Educated on LB dressing technique.      Vision         Perception     Praxis      Pertinent Vitals/Pain Pain Assessment: 0-10 Pain Score: 1  Pain Location: R hip Pain Descriptors / Indicators: Aching Pain Intervention(s): Monitored during session;Ice applied     Hand Dominance     Extremity/Trunk Assessment Upper Extremity Assessment Upper Extremity Assessment: Overall WFL for tasks assessed   Lower Extremity Assessment Lower Extremity Assessment: Defer to PT evaluation       Communication Communication Communication: No difficulties   Cognition Arousal/Alertness: Awake/alert Behavior During Therapy: WFL for tasks assessed/performed Overall Cognitive Status: Within Functional Limits for tasks assessed                                     General Comments       Exercises     Shoulder Instructions      Home Living Family/patient expects to be discharged to:: Private residence Living Arrangements: Spouse/significant other Available Help at Discharge: Family Type of Home: House Home Access: Stairs to enter [employer] of Steps: 8 Entrance Stairs-Rails: Can reach  both Home Layout: Two level;Bed/bath upstairs Alternate Level Stairs-Number of Steps: 9 Alternate Level Stairs-Rails: Right Bathroom Shower/Tub: Tub/shower unit;Walk-in shower   Bathroom Toilet: Standard     Home Equipment: Cane - single point          Prior Functioning/Environment Level of Independence: Independent        Comments: states he drives, he has to do all teh grocery shopping, house keeping. His wife is using a RW due to a back injury and they care for her elderly mom in the home.         OT Problem List: Decreased strength;Decreased range of motion;Decreased cognition;Decreased knowledge of use of DME or  AE;Decreased knowledge of precautions;Pain;Impaired balance (sitting and/or standing);Decreased activity tolerance      OT Treatment/Interventions: Self-care/ADL training;DME and/or AE instruction;Therapeutic activities;Patient/family education;Balance training;Cognitive remediation/compensation;Therapeutic exercise    OT Goals(Current goals can be found in the care plan section) Acute Rehab OT Goals Patient Stated Goal: get out of here OT Goal Formulation: With patient Time For Goal Achievement: 10/20/17 Potential to Achieve Goals: fair ADL Goals Pt Will Perform Lower Body Dressing: with mod assist;with adaptive equipment;sit to/from stand Pt Will Transfer to Toilet: with mod assist;stand pivot transfer;bedside commode Pt Will Perform Toileting - Clothing Manipulation and hygiene: with mod assist;sit to/from stand  OT Frequency: Min 2X/week   Barriers to D/C:            Co-evaluation PT/OT/SLP Co-Evaluation/Treatment: Yes Reason for Co-Treatment: For patient/therapist safety   OT goals addressed during session: ADL's and self-care;Other (comment)(mobility)      AM-PAC PT "6 Clicks" Daily Activity     Outcome Measure Help from another person eating meals?: None Help from another person taking care of personal grooming?: A Little Help from another person toileting, which includes using toliet, bedpan, or urinal?: Total Help from another person bathing (including washing, rinsing, drying)?: A Lot Help from another person to put on and taking off regular upper body clothing?: A Little Help from another person to put on and taking off regular lower body clothing?: Total 6 Click Score: 14   End of Session Equipment Utilized During Treatment: Gait belt;Rolling walker  Activity Tolerance: Patient tolerated treatment well;Other (comment)(BP low) Patient left: in chair;with call bell/phone within reach;with chair alarm set  OT Visit Diagnosis: Unsteadiness on feet  (R26.81);Pain Pain - Right/Left: Right Pain - part of body: Leg                Time: 4132-4401 OT Time Calculation (min): 28 min Charges:  OT General Charges $OT Visit: 1 Visit OT Evaluation $OT Eval Moderate Complexity: 1 Mod G-Codes:     Zehava Turski L Jeanette Moffatt OTR/L 10/13/2017, 4:09 PM

## 2017-10-13 NOTE — Progress Notes (Signed)
Physical Therapy Treatment Patient Details Name: Nathan Pennington MRN: [id number] DOB: 09-14-1935 Today's Date: 10/13/2017    History of Present Illness 82 y.o. s/p Rt anterior THA 10/12/2017 due to fall was out to dinner with his family and fell at [place], drove home and crawled into the house) with fracture 10/10/2017 . PMH includes DM, HTN, and hypothyroidism.    PT Comments    Second session with OT assisting. Pt's BP still low supine 130/47, however attempted to sit EOB again this afternoon, with BP still at 102/45 and pt more alert this afternoon, however motor planning/processing seemed to be challenging , and sit to stand with pivot to recliner was still a little challenging. Began talking with pt about possibilities to look into post acute rehab if necessary depending progress over next 2 days. Pt did state he does everything at home, and would not have anyone to be able to "assist" him in anyway.  We will see how he progresses over next few days. In recliner at the moment.      Follow Up Recommendations  SNF(unless progresses. Pt would like to go home )     Equipment Recommendations  Rolling walker with 5" wheels(tall one)    Recommendations for Other Services       Precautions / Restrictions Precautions Precautions: Fall Restrictions Weight Bearing Restrictions: Yes RLE Weight Bearing: Weight bearing as tolerated    Mobility  Bed Mobility Overal bed mobility: Needs Assistance Bed Mobility: Supine to Sit     Supine to sit: +2 for physical assistance;Mod assist     General bed mobility comments: assist with RLE and trunk.   Transfers Overall transfer level: Needs assistance Equipment used: Rolling walker (2 wheeled) Transfers: Sit to/from [facility] Sit to Stand: +2 physical assistance;Max assist Stand pivot transfers: +2 physical assistance;Max assist       General transfer comment: heavy assist to stand. Pt did better on second attempt.  Pivoted to chair. Had to raise bed and try 2 attempts   Ambulation/Gait Ambulation/Gait assistance: Mod assist;+2 physical assistance Ambulation Distance (Feet): 3 Feet(2-3 steps to pivot using RW to go from bed to recliner ) Assistive device: Rolling walker (2 wheeled)           Stairs            Wheelchair Mobility    Modified Rankin (Stroke Patients Only)       Balance                                            Cognition Arousal/Alertness: Awake/alert Behavior During Therapy: WFL for tasks assessed/performed Overall Cognitive Status: Within Functional Limits for tasks assessed(seemed to have some slowr motor planning /processing during session )                                        Exercises Other Exercises Other Exercises: R LE AAROM for ankle pumps, quad sets, knee flexion and hip ABD all 5 x times . also positioned when back in bed with pillow lateral side and roll under R heel to prevent ER and knee flexion on RLE.     General Comments        Pertinent Vitals/Pain Pain Assessment: 0-10 Pain Score: 1  Pain Location: R  hip Pain Descriptors / Indicators: Aching Pain Intervention(s): Monitored during session    Home Living Family/patient expects to be discharged to:: Private residence Living Arrangements: Spouse/significant other Available Help at Discharge: Family Type of Home: House Home Access: Stairs to enter Entrance Stairs-Rails: Can reach both Home Layout: Two level;Bed/bath upstairs Home Equipment: Kasandra Knudsen - single point      Prior Function Level of Independence: Independent      Comments: states he drives, he has to do all teh grocery shopping, house keeping. His wife is using a RW due to a back injury and they care for her elderly mom in the home.    PT Goals (current goals can now be found in the care plan section) Acute Rehab PT Goals Patient Stated Goal: I want to get better PT Goal Formulation:  With patient Time For Goal Achievement: 10/27/17 Potential to Achieve Goals: Good Progress towards PT goals: Progressing toward goals    Frequency    Min 5X/week      PT Plan Current plan remains appropriate    Co-evaluation PT/OT/SLP Co-Evaluation/Treatment: Yes Reason for Co-Treatment: Complexity of the patient's impairments (multi-system involvement) PT goals addressed during session: Mobility/safety with mobility OT goals addressed during session: ADL's and self-care;Other (comment)(mobility)      AM-PAC PT "6 Clicks" Daily Activity  Outcome Measure  Difficulty turning over in bed (including adjusting bedclothes, sheets and blankets)?: Unable Difficulty moving from lying on back to sitting on the side of the bed? : Unable Difficulty sitting down on and standing up from a chair with arms (e.g., wheelchair, bedside commode, etc,.)?: Unable Help needed moving to and from a bed to chair (including a wheelchair)?: Total Help needed walking in hospital room?: Total Help needed climbing 3-5 steps with a railing? : Total 6 Click Score: 6    End of Session Equipment Utilized During Treatment: Gait belt Activity Tolerance: Treatment limited secondary to medical complications (Comment)(limited session due to vagual EOB when sitting after about 2 minutes ) Patient left: in bed;with chair alarm set;in chair Nurse Communication: Mobility status PT Visit Diagnosis: Other abnormalities of gait and mobility (R26.89)     Time: 9381-0175 PT Time Calculation (min) (ACUTE ONLY): 28 min  Charges:  $Therapeutic Exercise: 8-22 mins $Therapeutic Activity: 23-37 mins                    G Codes:       Clide Dales, PT Pager: [phone] 10/13/2017    Dragan Tamburrino, Gatha Mayer 10/13/2017, 5:24 PM

## 2017-10-13 NOTE — Progress Notes (Signed)
Subjective: 1 Day Post-Op Procedure(s) (LRB): TOTAL HIP ARTHROPLASTY ANTERIOR APPROACH (Right) Patient reports pain as moderate.    Objective: Vital signs in last 24 hours: Temp:  [97.5 F (36.4 C)-98.1 F (36.7 C)] 97.6 F (36.4 C) (02/02 0144) Pulse Rate:  [74-101] 86 (02/02 0541) Resp:  [13-20] 20 (02/02 0541) BP: (121-186)/(54-84) 121/54 (02/02 0541) SpO2:  [92 %-99 %] 94 % (02/02 0829)  Intake/Output from previous day: 02/01 0701 - 02/02 0700 In: 2575 [I.V.:2060; IV Piggyback:515] Out: 2475 [Urine:1675; Blood:800] Intake/Output this shift: No intake/output data recorded.  Recent Labs    10/11/17 2327 10/13/17 0607  HGB 15.1 11.2*   Recent Labs    10/11/17 2327 10/13/17 0607  WBC 16.2* 13.7*  RBC 4.57 3.43*  HCT 43.6 32.9*  PLT 242 143*   Recent Labs    10/11/17 2327  NA 141  K 3.7  CL 108  CO2 26  BUN 22*  CREATININE 1.03  GLUCOSE 169*  CALCIUM 8.9   Recent Labs    10/11/17 2327  INR 0.90    Neurologically intact ABD soft Neurovascular intact Sensation intact distally Compartment soft Compartments are soft. Patient is in an externally rotated position and shortened.  He has pain with all range of motion of the hip. Assessment/Plan: 1 Day Post-Op Procedure(s) (LRB): TOTAL HIP ARTHROPLASTY ANTERIOR APPROACH (Right) I am very much concerned that the patient is dislocated or possibly has a fracture of his proximal femur.  He will need x-ray at this point and likely return to the OR today.  I will keep him n.p.o. for now.  [name] 10/13/2017, 8:40 AM

## 2017-10-13 NOTE — Evaluation (Signed)
Physical Therapy Evaluation Patient Details Name: Nathan Pennington MRN: [id number] DOB: 29-Aug-1936 Today's Date: 10/13/2017   History of Present Illness  82 y.o. s/p Rt anterior THA 10/12/2017 due to fall was out to dinner with his family and fell at [place], drove home and crawled into the house) with fracture 10/10/2017 . PMH includes DM, HTN, and hypothyroidism.    Clinical Impression  Pt s/p fall resulting in R hip fracture and s/p R DATHA. Pt found in supine bed with R ER and knee flexion, however with gentle assessment and AAROM able to tolerate normal motion on R (limiting hip and knee flexion). Pt did become vagual sitting EOB during assessment this morning, therefore had to lie back down. Felt better once supine. Will attempt to mobilize again this afternoon. Had to give continuous cues to keep patient on task for assessment and mobility as well, possible slow motor planning, will continue to assess this afternoon.     Follow Up Recommendations SNF    Equipment Recommendations  Rolling walker with 5" wheels(tall one)    Recommendations for Other Services       Precautions / Restrictions Precautions Precautions: Fall Restrictions Weight Bearing Restrictions: Yes RLE Weight Bearing: Weight bearing as tolerated      Mobility  Bed Mobility Overal bed mobility: Needs Assistance Bed Mobility: Supine to Sit     Supine to sit: +2 for physical assistance;Mod assist     General bed mobility comments: assist with RLE and trunk.   Sat EOB for about 2-3 minutes , started to be less responsive, and then laid back down with increased responsiveness immediately. Will continue to assess mobility this afternoon. Reported to nurse.         Pertinent Vitals/Pain Pain Assessment: 0-10 Pain Score: 1  Pain Location: R hip Pain Descriptors / Indicators: Aching Pain Intervention(s): Monitored during session;Ice applied    Home Living Family/patient expects to be discharged to::  Private residence Living Arrangements: Spouse/significant other Available Help at Discharge: Family Type of Home: House Home Access: Stairs to enter Entrance Stairs-Rails: Can reach both Entrance Stairs-Number of Steps: 8 Home Layout: Two level;Bed/bath upstairs Home Equipment: Cane - single point      Prior Function Level of Independence: Independent         Comments: states he drives, he has to do all teh grocery shopping, house keeping. His wife is using a RW due to a back injury and they care for her elderly mom in the home.      Hand Dominance        Extremity/Trunk Assessment   Upper Extremity Assessment Upper Extremity Assessment: Overall WFL for tasks assessed    Lower Extremity Assessment Lower Extremity Assessment: Defer to PT evaluation RLE Deficits / Details: pt was in ER and knee flexion when I arrived. I was able to get patient in neutral hip position with R knee extension to 5degrees, all was AAROM for R LE motion at this time. all 2/5 for R LE a tthis time.        Communication   Communication: No difficulties  Cognition Arousal/Alertness: Awake/alert Behavior During Therapy: WFL for tasks assessed/performed Overall Cognitive Status: Within Functional Limits for tasks assessed                                        General Comments      Exercises Other  Exercises Other Exercises: R LE AAROM for ankle pumps, quad sets, knee flexion and hip ABD all 5 x times . also positioned when back in bed with pillow lateral side and roll under R heel to prevent ER and knee flexion on RLE.    Assessment/Plan    PT Assessment Patient needs continued PT services  PT Problem List Decreased strength;Decreased range of motion;Decreased activity tolerance;Decreased mobility       PT Treatment Interventions DME instruction;Gait training;Stair training;Functional mobility training;Therapeutic activities;Therapeutic exercise;Patient/family education     PT Goals (Current goals can be found in the Care Plan section)  Acute Rehab PT Goals Patient Stated Goal: I want to get better PT Goal Formulation: With patient Time For Goal Achievement: 10/27/17 Potential to Achieve Goals: Good    Frequency 7X/week   Barriers to discharge        Co-evaluation   Reason for Co-Treatment: For patient/therapist safety   OT goals addressed during session: ADL's and self-care;Other (comment)(mobility)       AM-PAC PT "6 Clicks" Daily Activity  Outcome Measure Difficulty turning over in bed (including adjusting bedclothes, sheets and blankets)?: Unable Difficulty moving from lying on back to sitting on the side of the bed? : Unable Difficulty sitting down on and standing up from a chair with arms (e.g., wheelchair, bedside commode, etc,.)?: Unable Help needed moving to and from a bed to chair (including a wheelchair)?: Total Help needed walking in hospital room?: Total Help needed climbing 3-5 steps with a railing? : Total 6 Click Score: 6    End of Session Equipment Utilized During Treatment: Gait belt Activity Tolerance: Treatment limited secondary to medical complications (Comment)(limited session due to vagual EOB when sitting after about 2 minutes ) Patient left: in bed;with bed alarm set Nurse Communication: Mobility status PT Visit Diagnosis: Other abnormalities of gait and mobility (R26.89)    Time: 0981-1914 PT Time Calculation (min) (ACUTE ONLY): 55 min   Charges:   PT Evaluation $PT Eval Moderate Complexity: 1 Mod PT Treatments $Therapeutic Exercise: 8-22 mins $Therapeutic Activity: 23-37 mins   PT G Codes:        Clide Dales, PT Pager: [phone] 10/13/2017   Ammaar Encina, Gatha Mayer 10/13/2017, 4:15 PM

## 2017-10-13 NOTE — Op Note (Signed)
NAME:  Nathan Pennington, Nathan Pennington           ACCOUNT NO.:  [id number]  MEDICAL RECORD NO.:  [id number]  LOCATION:  0973                         FACILITY:  [facility]  PHYSICIAN:  [name], M.D.   DATE OF BIRTH:  06-25-1936  DATE OF PROCEDURE:  10/12/2017 DATE OF DISCHARGE:                              OPERATIVE REPORT   PREOPERATIVE DIAGNOSIS:  Displaced femoral neck fracture, right.  POSTOPERATIVE DIAGNOSIS:  Displaced femoral neck fracture, right.  PROCEDURE: 1. Right total hip replacement with a Corail stem size 14, a 58 mm     Gription pinnacle cup, no holes, a +4 neutral liner, and a 36 mm -5     hip ball. 2. Interpretation of multiple intraoperative fluoroscopic images.  SURGEON:  Dr. Berenice Primas.  ASSISTANT:  Gary Fleet, PA.  ANESTHESIA:  General.  BRIEF HISTORY:  [name] is an 82 year old male, who was in remarkably good health.  He had driven from [facility] to [facility] the night of his femoral neck fracture for dinner and was driving back with significant hip pain because of a fall that he had there at the restaurant, he was having worsening pain and was having difficulty standing and walking.  Because of this, he came in for evaluation.  His x-ray at that time showed a displaced femoral neck fracture.  I met him and talked to him about treatment options.  Given the fact that he was such an independent person living alone, was taking care of his mother- in-law who is 41, I felt that the appropriate course of action will be total hip replacement versus hemiarthroplasty.  We discussed that with him and he did wish to proceed with that.  He understood the potential slight increased risk of surgical time, blood loss, and a slight dislocation, increased risks, and he was brought to the operating for this procedure.  DESCRIPTION OF PROCEDURE:  The patient was brought to the operating room.  After adequate anesthesia was obtained with general anesthetic, patient was placed  supine on the operating table.  The right hip was then prepped and draped in usual sterile fashion.  He was placed on the [place] bed prior to prep and drape.  At this point, attention was turned to the right hip where incision was made for an anterior approach to the hip, subcutaneous tissue, dissected down to the level of the tensor fascia.  The fascia over this was then identified and divided in line with its fibers, and the muscle was finger fractured and retractors were put in place above and below the femoral neck.  At this point, capsule was opened and released the capsule anteriorly.  We then made a provisional neck cut, took out the napkin ring and then removed the head.  The head measured 52 on the back table.  We then put retractors above and below the acetabulum, did a labrectomy, sequentially reamed up to a level of 57 mm and put in a 58 mm Gription pinnacle cup with 45 degrees of lateral opening, 30 degrees of anteversion.  Once this was completed, the attention was turned towards placing a hole eliminator and the +4 neutral liner.  We then turned to the stem side. Stem  side was identified and retractors were put in place behind, the hip was externally rotated, extended, and brought into the wound with the arm from the [place] bed.  We then opened the canal with a cookie cutter, followed by a rongeur, followed by sequential rasping up to a level of 13 mm.  We then did a trial, was slightly long with that.  I felt like we needed to get calcar planed, so we planed down about 4 mm, and then, felt there was still some motion with a 13 so we vigorously work to get a 14 and placed, and ultimately, we were able to do so.  We got a 14 stem in, put it with a -5 ball stainless steel and then did a trial reduction.  Excellent stability was achieved in the trial reduction and the hip was then reduced and final images were taken.  We were fairly symmetric in terms of leg length and the hip  itself showed to be in excellent position.  At this point, the wounds were copiously irrigated and suctioned dry.  The capsule was closed with 1 Vicryl running, the tensor fascia was closed with 0 Vicryl running, the skin with 0 and 2-0 Vicryl, and skin staples.  Sterile compressive dressing was applied, and patient was taken to the recovery room and was noted to be in satisfactory condition.  Estimated blood loss for procedure was 800 mL. Of note, the multiple intraoperative fluoroscopic images were taken, interpreted by me to assess the alignment and leg length.     [name], M.D.     Corliss Skains  D:  10/12/2017  T:  10/13/2017  Job:  [id number]  cc:   [name], M.D. Fax: [phone]

## 2017-10-13 NOTE — Progress Notes (Signed)
PT Note  Patient Details Name: Nathan Pennington MRN: [id number] DOB: 05/02/1936  Assessed pt this morning, full write up to follow. Pt very talkative and had to keep redirecting to task/assessment, however with increased time was able to assess and AAROM for R knee and hip area with very little pain. Did assist pt to come to sitting EOB, however after about 3 minutes pt was beginning to vagual, therefore assisted pt back down to bed. Shared this with the nurse, and will attempt OOB this afternoon.         Clide Dales 10/13/2017, 1:01 PM  Clide Dales, PT Pager: [phone] 10/13/2017

## 2017-10-14 LAB — GLUCOSE, CAPILLARY
GLUCOSE-CAPILLARY: 160 mg/dL — AB (ref 65–99)
GLUCOSE-CAPILLARY: 225 mg/dL — AB (ref 65–99)
Glucose-Capillary: 139 mg/dL — ABNORMAL HIGH (ref 65–99)
Glucose-Capillary: 180 mg/dL — ABNORMAL HIGH (ref 65–99)
Glucose-Capillary: 153 mg/dL — ABNORMAL HIGH (ref 65–99)

## 2017-10-14 LAB — CBC
HCT: 28 % — ABNORMAL LOW (ref 39.0–52.0)
HEMOGLOBIN: 9.5 g/dL — AB (ref 13.0–17.0)
MCH: 32.1 pg (ref 26.0–34.0)
MCHC: 33.9 g/dL (ref 30.0–36.0)
MCV: 94.6 fL (ref 78.0–100.0)
Platelets: 157 10*3/uL (ref 150–400)
RBC: 2.96 MIL/uL — ABNORMAL LOW (ref 4.22–5.81)
RDW: 13.5 % (ref 11.5–15.5)
WBC: 12.3 10*3/uL — ABNORMAL HIGH (ref 4.0–10.5)

## 2017-10-14 NOTE — Progress Notes (Signed)
Subjective: 2 Days Post-Op Procedure(s) (LRB): TOTAL HIP ARTHROPLASTY ANTERIOR APPROACH (Right) Patient reports pain as mild.  Taking by mouth and voiding okay.  He tells me that his wife and mother-in-law who he lives with at his home both ambulate with walker's.  They will not be able to help him much.  Objective: Vital signs in last 24 hours: Temp:  [97.9 F (36.6 C)-98.8 F (37.1 C)] 98.6 F (37 C) (02/03 0450) Pulse Rate:  [92-117] 99 (02/03 0450) Resp:  [17-20] 20 (02/03 0450) BP: (118-134)/(47-96) 125/85 (02/03 0450) SpO2:  [94 %-98 %] 95 % (02/03 0450)  Intake/Output from previous day: 02/02 0701 - 02/03 0700 In: 240 [P.O.:240] Out: 400 [Urine:400] Intake/Output this shift: Total I/O In: -  Out: 250 [Urine:250]  Recent Labs    10/11/17 2327 10/13/17 0607 10/14/17 0540  HGB 15.1 11.2* 9.5*   Recent Labs    10/13/17 0607 10/14/17 0540  WBC 13.7* 12.3*  RBC 3.43* 2.96*  HCT 32.9* 28.0*  PLT 143* 157   Recent Labs    10/11/17 2327 10/13/17 1115  NA 141 136  K 3.7 3.8  CL 108 103  CO2 26 25  BUN 22* 12  CREATININE 1.03 0.87  GLUCOSE 169* 192*  CALCIUM 8.9 8.0*   Recent Labs    10/11/17 2327  INR 0.90  X-ray of pelvis and right hip yesterday showed good position of total hip prosthesis.  No evidence of dislocation or periprosthetic problems. Right hip exam: Neurovascular intact Sensation intact distally Intact pulses distally Dorsiflexion/Plantar flexion intact Incision: dressing C/D/I Compartment soft  Assessment/Plan: 2 Days Post-Op Procedure(s) (LRB): TOTAL HIP ARTHROPLASTY ANTERIOR APPROACH (Right) Plan: Aspirin 325 mg daily with SCDs for DVT prophylaxis. Weight-bear as tolerated on right without hip precautions. Continue physical therapy. Will probably need skilled nursing facility based upon his slow progress with physical therapy and his limited help at home. I will order a social work consult.  [place] G 10/14/2017, 9:41 AM

## 2017-10-14 NOTE — Progress Notes (Signed)
Physical Therapy Treatment Patient Details Name: Nathan Pennington MRN: [id number] DOB: 1935-11-17 Today's Date: 10/14/2017    History of Present Illness 82 y.o. s/p Rt anterior THA 10/12/2017 due to fall was out to dinner with his family and fell at [facility], drove home and crawled into the house) with fracture 10/10/2017 . PMH includes DM, HTN, and hypothyroidism.    PT Comments    Pt tolerated session today with improved mobility and ambulation today. Still requiring assistance with RLE (exercises, movement and with all mobility) however improved from yesterday.  Will continue to follow to improve strengthening , and mobility.     Follow Up Recommendations  SNF(unless progresses. Pt would like to go home )     Equipment Recommendations  Rolling walker with 5" wheels(tall one)    Recommendations for Other Services       Precautions / Restrictions Restrictions Weight Bearing Restrictions: No RLE Weight Bearing: Weight bearing as tolerated    Mobility  Bed Mobility Overal bed mobility: Needs Assistance Bed Mobility: Supine to Sit     Supine to sit: Mod assist     General bed mobility comments: assist with RLE and trunk.   Transfers Overall transfer level: Needs assistance Equipment used: Rolling walker (2 wheeled) Transfers: Sit to/from Stand Sit to Stand: +2 physical assistance;Mod assist(just to rise from surface )         General transfer comment: cues for RW hand placment and safety   Ambulation/Gait Ambulation/Gait assistance: +2 safety/equipment;Min assist(followed with recliner due to low BP yesterday ) Ambulation Distance (Feet): 50 Feet Assistive device: Rolling walker (2 wheeled) Gait Pattern/deviations: Step-through pattern     General Gait Details: tried to educated with step to pattern however pt was not able to comprehend this pattern, so he was performing step to pattern with small steps    Stairs            Wheelchair Mobility     Modified Rankin (Stroke Patients Only)       Balance                                            Cognition Arousal/Alertness: Awake/alert Behavior During Therapy: WFL for tasks assessed/performed Overall Cognitive Status: Within Functional Limits for tasks assessed(pt much more clear and appropriate for conversation today )                                        Exercises Total Joint Exercises Ankle Circles/Pumps: AROM;Both;10 reps;Supine Quad Sets: AROM;Right;Supine;10 reps Heel Slides: AAROM;10 reps;Right;Supine Hip ABduction/ADduction: AAROM;10 reps;Right;Supine Straight Leg Raises: AAROM;10 reps;Right;Supine    General Comments        Pertinent Vitals/Pain Pain Score: 2  Pain Location: R hip and anteriro thigh area Pain Descriptors / Indicators: Aching Pain Intervention(s): Monitored during session;Ice applied    Home Living                      Prior Function            PT Goals (current goals can now be found in the care plan section) Acute Rehab PT Goals Patient Stated Goal: I want to get better PT Goal Formulation: With patient Time For Goal Achievement: 10/27/17 Potential to Achieve Goals: Good Progress  towards PT goals: Progressing toward goals    Frequency    Min 5X/week      PT Plan Current plan remains appropriate    Co-evaluation              AM-PAC PT "6 Clicks" Daily Activity  Outcome Measure  Difficulty turning over in bed (including adjusting bedclothes, sheets and blankets)?: Unable Difficulty moving from lying on back to sitting on the side of the bed? : Unable Difficulty sitting down on and standing up from a chair with arms (e.g., wheelchair, bedside commode, etc,.)?: Unable Help needed moving to and from a bed to chair (including a wheelchair)?: A Lot Help needed walking in hospital room?: A Lot Help needed climbing 3-5 steps with a railing? : A Lot 6 Click Score: 9    End  of Session Equipment Utilized During Treatment: Gait belt Activity Tolerance: Patient tolerated treatment well(limited session due to vagual EOB when sitting after about 2 minutes ) Patient left: with chair alarm set;in chair Nurse Communication: Mobility status PT Visit Diagnosis: Other abnormalities of gait and mobility (R26.89)     Time: 0223-3612 PT Time Calculation (min) (ACUTE ONLY): 43 min  Charges:  $Gait Training: 8-22 mins $Therapeutic Exercise: 8-22 mins $Therapeutic Activity: 8-22 mins                    G Codes:       Nathan Pennington, PT Pager: [phone] 10/14/2017    Nathan Pennington, Gatha Mayer 10/14/2017, 1:20 PM

## 2017-10-14 NOTE — Progress Notes (Signed)
Patient ID: Nathan Pennington, male   DOB: 1936-04-24, 82 y.o.   MRN: [id number]  PROGRESS NOTE    Nathan Pennington  WUJ:[id number] DOB: 1936/04/17 DOA: 10/11/2017  PCP: System, Pcp Not In   Brief Narrative:  Nathan Pennington a 82 y.o.malewithhistory of hypertension, hypothyroidism, diabetes mellitus presented to the ER with complaints of persistent right hip pain after patient had a fall after having dinner. Patient stated he had gone with his family for dinner celebrating his birthday when he tripped and fell. Denied hitting his head or losing consciousness. He came to the ER where x-rays revealed right hip fracture and Dr. Berenice Primas on call orthopedic surgeon was consulted. Patient was admitted    Assessment & Plan:   Principal Problem:   Closed right hip fracture, initial encounter [facility]) Active Problems:   Diabetes mellitus type 2 in obese ([place])   Hypothyroidism   Essential hypertension   HLD (hyperlipidemia)   #1 right hip fracture: Status post surgical repair day number 2. Continue physical therapy and occupational therapy.  Patient most likely will require going to skilled facility tomorrow  #2 type 2 diabetes: Blood sugar appears controlled.  Continue sliding scale insulin.  We'll resume oral hypoglycemics tomorrow prior to discharge  #3 hypertension: Blood pressure is well controlled.  Continue current regimen  #4 hypothyroidism: continue to follow      Consultants:     Procedures: None  DVT prophylaxis:SCDs. Code Status:Full code. Family Communication:Discussed with patient. Disposition Plan:Skilled nursing facility likely. Consults called:Orthopedics   Antimicrobials: None  Subjective: Patient is feeling better today.  He walked some distance with physical therapy.  Agree to go to skilled facility.  Slightly confused but much better overall.  Objective: Vitals:   10/13/17 2055 10/13/17 2223 10/14/17 0000 10/14/17 0450  BP: (!)  132/51  (!) 134/52 125/85  Pulse: (!) 101  (!) 101 99  Resp: 20  20 20   Temp: 98.2 F (36.8 C)  98.8 F (37.1 C) 98.6 F (37 C)  TempSrc: Oral  Oral Oral  SpO2: 94% 95% 95% 95%  Weight:      Height:        Intake/Output Summary (Last 24 hours) at 10/14/2017 1230 Last data filed at 10/14/2017 1005 Gross per 24 hour  Intake 240 ml  Output 650 ml  Net -410 ml   Filed Weights   10/11/17 2201  Weight: 103 kg (227 lb)    Examination:  General exam: Appears calm and comfortable  Respiratory system: Clear to auscultation. Respiratory effort normal. Cardiovascular system: S1 & S2 heard, RRR. No JVD, murmurs, rubs, gallops or clicks. No pedal edema. Gastrointestinal system: Abdomen is nondistended, soft and nontender. No organomegaly or masses felt. Normal bowel sounds heard. Central nervous system: Alert and oriented. No focal neurological deficits. Extremities: Symmetric 5 x 5 power. Skin: No rashes, lesions or ulcers Psychiatry: Judgement and insight appear normal. Mood & affect appropriate.     Data Reviewed:   CBC: Recent Labs  Lab 10/11/17 2327 10/13/17 0607 10/14/17 0540  WBC 16.2* 13.7* 12.3*  NEUTROABS 14.0*  --   --   HGB 15.1 11.2* 9.5*  HCT 43.6 32.9* 28.0*  MCV 95.4 95.9 94.6  PLT 242 143* 956   Basic Metabolic Panel: Recent Labs  Lab 10/11/17 2327 10/13/17 1115  NA 141 136  K 3.7 3.8  CL 108 103  CO2 26 25  GLUCOSE 169* 192*  BUN 22* 12  CREATININE 1.03 0.87  CALCIUM 8.9 8.0*   GFR: Estimated Creatinine Clearance: 84.1 mL/min (by C-G formula based on SCr of 0.87 mg/dL). Liver Function Tests: Recent Labs  Lab 10/11/17 2327  AST 20  ALT 13*  ALKPHOS 53  BILITOT 0.7  PROT 6.8  ALBUMIN 3.9   No results for input(s): LIPASE, AMYLASE in the last 168 hours. No results for input(s): AMMONIA in the last 168 hours. Coagulation Profile: Recent Labs  Lab 10/11/17 2327  INR 0.90   Cardiac Enzymes: No results for input(s): CKTOTAL, CKMB,  CKMBINDEX, TROPONINI in the last 168 hours. BNP (last 3 results) No results for input(s): PROBNP in the last 8760 hours. HbA1C: No results for input(s): HGBA1C in the last 72 hours. CBG: Recent Labs  Lab 10/13/17 1649 10/13/17 2021 10/13/17 2355 10/14/17 0500 10/14/17 1228  GLUCAP 226* 149* 139* 160* 180*   Lipid Profile: No results for input(s): CHOL, HDL, LDLCALC, TRIG, CHOLHDL, LDLDIRECT in the last 72 hours. Thyroid Function Tests: No results for input(s): TSH, T4TOTAL, FREET4, T3FREE, THYROIDAB in the last 72 hours. Anemia Panel: No results for input(s): VITAMINB12, FOLATE, FERRITIN, TIBC, IRON, RETICCTPCT in the last 72 hours. Urine analysis: No results found for: COLORURINE, APPEARANCEUR, LABSPEC, PHURINE, GLUCOSEU, HGBUR, BILIRUBINUR, KETONESUR, PROTEINUR, UROBILINOGEN, NITRITE, LEUKOCYTESUR Sepsis Labs: @LABRCNTIP (procalcitonin:4,lacticidven:4)  ) Recent Results (from the past 240 hour(s))  Surgical PCR screen     Status: None   Collection Time: 10/12/17  6:48 AM  Result Value Ref Range Status   MRSA, PCR NEGATIVE NEGATIVE Final   Staphylococcus aureus NEGATIVE NEGATIVE Final    Comment: (NOTE) The Xpert SA Assay (FDA approved for NASAL specimens in patients 80 years of age and older), is one component of a comprehensive surveillance program. It is not intended to diagnose infection nor to guide or monitor treatment. Performed at [facility], [place] [address] [place], [place] [address]          Radiology Studies: Dg Pelvis 1-2 Views  Result Date: 10/13/2017 CLINICAL DATA:  Status post right hip replacement October 12, 2017. EXAM: PELVIS - 1-2 VIEW COMPARISON:  None. FINDINGS: Patient is status post right hip replacement. Acetabular and femoral components are in good position. Soft tissue gas and skin staples consistent with interval surgery. No other acute abnormalities. IMPRESSION: Postoperative changes in the right hip. Electronically  Signed   By: Dorise Bullion III M.D   On: 10/13/2017 09:03   Dg C-arm 1-60 Min-no Report  Result Date: 10/12/2017 Fluoroscopy was utilized by the requesting physician.  No radiographic interpretation.        Scheduled Meds: . aspirin EC  325 mg Oral Q breakfast  . brimonidine  1 drop Both Eyes Daily  . docusate sodium  100 mg Oral BID  . insulin aspart  0-15 Units Subcutaneous Q4H  . latanoprost  1 drop Both Eyes QHS  . levothyroxine  75 mcg Oral QAC breakfast  . lisinopril  2.5 mg Oral Daily  . mometasone-formoterol  2 puff Inhalation BID  . tamsulosin  0.4 mg Oral Daily   Continuous Infusions: . sodium chloride 75 mL/hr at 10/13/17 2155  . methocarbamol (ROBAXIN)  IV Stopped (10/13/17 0055)     LOS: 2 days    Time spent: 33minutes    GARBA,LAWAL, MD [facility] Pager [phone]  If 7PM-7AM, please contact night-coverage [email or web address] Password [email or web address] 10/14/2017, 12:30 PM

## 2017-10-15 ENCOUNTER — Encounter (HOSPITAL_COMMUNITY): Payer: Self-pay | Admitting: Orthopedic Surgery

## 2017-10-15 DIAGNOSIS — T148XXA Other injury of unspecified body region, initial encounter: Secondary | ICD-10-CM | POA: Diagnosis not present

## 2017-10-15 DIAGNOSIS — D62 Acute posthemorrhagic anemia: Secondary | ICD-10-CM | POA: Diagnosis not present

## 2017-10-15 DIAGNOSIS — G8911 Acute pain due to trauma: Secondary | ICD-10-CM | POA: Diagnosis not present

## 2017-10-15 DIAGNOSIS — Z79899 Other long term (current) drug therapy: Secondary | ICD-10-CM | POA: Diagnosis not present

## 2017-10-15 DIAGNOSIS — Z7901 Long term (current) use of anticoagulants: Secondary | ICD-10-CM | POA: Diagnosis not present

## 2017-10-15 DIAGNOSIS — M25551 Pain in right hip: Secondary | ICD-10-CM | POA: Diagnosis not present

## 2017-10-15 DIAGNOSIS — R488 Other symbolic dysfunctions: Secondary | ICD-10-CM | POA: Diagnosis not present

## 2017-10-15 DIAGNOSIS — I10 Essential (primary) hypertension: Secondary | ICD-10-CM | POA: Diagnosis not present

## 2017-10-15 DIAGNOSIS — E039 Hypothyroidism, unspecified: Secondary | ICD-10-CM | POA: Diagnosis not present

## 2017-10-15 DIAGNOSIS — Z8673 Personal history of transient ischemic attack (TIA), and cerebral infarction without residual deficits: Secondary | ICD-10-CM | POA: Diagnosis not present

## 2017-10-15 DIAGNOSIS — Z9181 History of falling: Secondary | ICD-10-CM | POA: Diagnosis not present

## 2017-10-15 DIAGNOSIS — Z471 Aftercare following joint replacement surgery: Secondary | ICD-10-CM | POA: Diagnosis not present

## 2017-10-15 DIAGNOSIS — S8990XA Unspecified injury of unspecified lower leg, initial encounter: Secondary | ICD-10-CM | POA: Diagnosis not present

## 2017-10-15 DIAGNOSIS — Z96641 Presence of right artificial hip joint: Secondary | ICD-10-CM | POA: Diagnosis not present

## 2017-10-15 DIAGNOSIS — Z7982 Long term (current) use of aspirin: Secondary | ICD-10-CM | POA: Diagnosis not present

## 2017-10-15 DIAGNOSIS — E785 Hyperlipidemia, unspecified: Secondary | ICD-10-CM | POA: Diagnosis not present

## 2017-10-15 DIAGNOSIS — J449 Chronic obstructive pulmonary disease, unspecified: Secondary | ICD-10-CM | POA: Diagnosis not present

## 2017-10-15 DIAGNOSIS — E034 Atrophy of thyroid (acquired): Secondary | ICD-10-CM | POA: Diagnosis not present

## 2017-10-15 DIAGNOSIS — M25561 Pain in right knee: Secondary | ICD-10-CM | POA: Diagnosis not present

## 2017-10-15 DIAGNOSIS — S79911A Unspecified injury of right hip, initial encounter: Secondary | ICD-10-CM | POA: Diagnosis not present

## 2017-10-15 DIAGNOSIS — Z885 Allergy status to narcotic agent status: Secondary | ICD-10-CM | POA: Diagnosis not present

## 2017-10-15 DIAGNOSIS — R262 Difficulty in walking, not elsewhere classified: Secondary | ICD-10-CM | POA: Diagnosis not present

## 2017-10-15 DIAGNOSIS — E1169 Type 2 diabetes mellitus with other specified complication: Secondary | ICD-10-CM | POA: Diagnosis not present

## 2017-10-15 DIAGNOSIS — J45909 Unspecified asthma, uncomplicated: Secondary | ICD-10-CM | POA: Diagnosis not present

## 2017-10-15 DIAGNOSIS — T8189XA Other complications of procedures, not elsewhere classified, initial encounter: Secondary | ICD-10-CM | POA: Diagnosis not present

## 2017-10-15 DIAGNOSIS — Y838 Other surgical procedures as the cause of abnormal reaction of the patient, or of later complication, without mention of misadventure at the time of the procedure: Secondary | ICD-10-CM | POA: Diagnosis present

## 2017-10-15 DIAGNOSIS — E119 Type 2 diabetes mellitus without complications: Secondary | ICD-10-CM | POA: Diagnosis not present

## 2017-10-15 DIAGNOSIS — Z1619 Resistance to other specified beta lactam antibiotics: Secondary | ICD-10-CM | POA: Diagnosis not present

## 2017-10-15 DIAGNOSIS — E1151 Type 2 diabetes mellitus with diabetic peripheral angiopathy without gangrene: Secondary | ICD-10-CM | POA: Diagnosis not present

## 2017-10-15 DIAGNOSIS — T8451XA Infection and inflammatory reaction due to internal right hip prosthesis, initial encounter: Secondary | ICD-10-CM | POA: Diagnosis not present

## 2017-10-15 DIAGNOSIS — T8131XA Disruption of external operation (surgical) wound, not elsewhere classified, initial encounter: Secondary | ICD-10-CM | POA: Diagnosis not present

## 2017-10-15 DIAGNOSIS — S72001A Fracture of unspecified part of neck of right femur, initial encounter for closed fracture: Secondary | ICD-10-CM | POA: Diagnosis not present

## 2017-10-15 DIAGNOSIS — Z4801 Encounter for change or removal of surgical wound dressing: Secondary | ICD-10-CM | POA: Diagnosis present

## 2017-10-15 DIAGNOSIS — M6281 Muscle weakness (generalized): Secondary | ICD-10-CM | POA: Diagnosis not present

## 2017-10-15 DIAGNOSIS — B9620 Unspecified Escherichia coli [E. coli] as the cause of diseases classified elsewhere: Secondary | ICD-10-CM | POA: Diagnosis not present

## 2017-10-15 DIAGNOSIS — Z87891 Personal history of nicotine dependence: Secondary | ICD-10-CM | POA: Diagnosis not present

## 2017-10-15 DIAGNOSIS — Z7984 Long term (current) use of oral hypoglycemic drugs: Secondary | ICD-10-CM | POA: Diagnosis not present

## 2017-10-15 DIAGNOSIS — Z95828 Presence of other vascular implants and grafts: Secondary | ICD-10-CM | POA: Diagnosis not present

## 2017-10-15 DIAGNOSIS — L7622 Postprocedural hemorrhage and hematoma of skin and subcutaneous tissue following other procedure: Secondary | ICD-10-CM | POA: Diagnosis not present

## 2017-10-15 DIAGNOSIS — X58XXXA Exposure to other specified factors, initial encounter: Secondary | ICD-10-CM | POA: Diagnosis not present

## 2017-10-15 DIAGNOSIS — S72001D Fracture of unspecified part of neck of right femur, subsequent encounter for closed fracture with routine healing: Secondary | ICD-10-CM | POA: Diagnosis not present

## 2017-10-15 DIAGNOSIS — E782 Mixed hyperlipidemia: Secondary | ICD-10-CM | POA: Diagnosis not present

## 2017-10-15 DIAGNOSIS — Z9889 Other specified postprocedural states: Secondary | ICD-10-CM | POA: Diagnosis not present

## 2017-10-15 DIAGNOSIS — H409 Unspecified glaucoma: Secondary | ICD-10-CM | POA: Diagnosis not present

## 2017-10-15 DIAGNOSIS — L7632 Postprocedural hematoma of skin and subcutaneous tissue following other procedure: Secondary | ICD-10-CM | POA: Diagnosis not present

## 2017-10-15 DIAGNOSIS — E669 Obesity, unspecified: Secondary | ICD-10-CM | POA: Diagnosis not present

## 2017-10-15 LAB — GLUCOSE, CAPILLARY
GLUCOSE-CAPILLARY: 141 mg/dL — AB (ref 65–99)
Glucose-Capillary: 148 mg/dL — ABNORMAL HIGH (ref 65–99)
Glucose-Capillary: 148 mg/dL — ABNORMAL HIGH (ref 65–99)
Glucose-Capillary: 130 mg/dL — ABNORMAL HIGH (ref 65–99)
Glucose-Capillary: 176 mg/dL — ABNORMAL HIGH (ref 65–99)

## 2017-10-15 LAB — CBC
HCT: 25.6 % — ABNORMAL LOW (ref 39.0–52.0)
HEMOGLOBIN: 8.8 g/dL — AB (ref 13.0–17.0)
MCH: 32.7 pg (ref 26.0–34.0)
MCHC: 34.4 g/dL (ref 30.0–36.0)
MCV: 95.2 fL (ref 78.0–100.0)
Platelets: 163 10*3/uL (ref 150–400)
RBC: 2.69 MIL/uL — ABNORMAL LOW (ref 4.22–5.81)
RDW: 13.4 % (ref 11.5–15.5)
WBC: 9.3 10*3/uL (ref 4.0–10.5)

## 2017-10-15 MED ORDER — ASPIRIN EC 325 MG PO TBEC: 325.0000 mg | DELAYED_RELEASE_TABLET | Freq: Two times a day (BID) | ORAL | 0 refills | Status: DC

## 2017-10-15 NOTE — Progress Notes (Signed)
Subjective: 3 Days Post-Op Procedure(s) (LRB): TOTAL HIP ARTHROPLASTY ANTERIOR APPROACH (Right) Patient reports pain as mild.  Taking by mouth and voiding okay. Slow progress with physical therapy.  No frank dizziness.  Objective: Vital signs in last 24 hours: Temp:  [98.2 F (36.8 C)-99.3 F (37.4 C)] 98.2 F (36.8 C) (02/04 0514) Pulse Rate:  [70-103] 90 (02/04 0514) Resp:  [14-18] 14 (02/04 0514) BP: (96-135)/(53-60) 135/53 (02/04 0514) SpO2:  [91 %-98 %] 96 % (02/04 0514)  Intake/Output from previous day: 02/03 0701 - 02/04 0700 In: 910 [P.O.:910] Out: 1300 [Urine:1300] Intake/Output this shift: No intake/output data recorded.  Recent Labs    10/13/17 0607 10/14/17 0540 10/15/17 0558  HGB 11.2* 9.5* 8.8*   Recent Labs    10/14/17 0540 10/15/17 0558  WBC 12.3* 9.3  RBC 2.96* 2.69*  HCT 28.0* 25.6*  PLT 157 163   Recent Labs    10/13/17 1115  NA 136  K 3.8  CL 103  CO2 25  BUN 12  CREATININE 0.87  GLUCOSE 192*  CALCIUM 8.0*   No results for input(s): LABPT, INR in the last 72 hours. Right hip exam: Minimal pain with range of motion of the right hip. Neurovascular intact Sensation intact distally Intact pulses distally Dorsiflexion/Plantar flexion intact Incision: dressing C/D/I Compartment soft  Assessment/Plan: 3 Days Post-Op Procedure(s) (LRB): TOTAL HIP ARTHROPLASTY ANTERIOR APPROACH (Right)  Acute blood loss anemia, expected.  Asymptomatic. Plan: Weight-bear as tolerated on right lower extremity with posterior hip precautions. Would use aspirin enteric-coated 325 mg twice daily with food for DVT prophylaxis 1 month postop. Minimize narcotics, but pain medicine written for Norco 5 mg to use as needed. Discharge to SNF hopefully today. Follow-up with Dr. Berenice Primas in 2 weeks.  [place] G 10/15/2017, 9:06 AM

## 2017-10-15 NOTE — Progress Notes (Signed)
Rober Min[place] nurse Olu RN received report about patient. Grisell Bissette ClarkRN

## 2017-10-15 NOTE — Discharge Summary (Signed)
Physician Discharge Summary  Nathan Pennington GYI:[id number] DOB: [birth year] DOA: 10/11/2017  PCP: System, Pcp Not In  Admit date: 10/11/2017 Discharge date: 10/15/2017  Admitted From: Home Disposition: SNF   Recommendations for Outpatient Follow-up:  1. Follow up with orthopedics in 2 weeks  Home Health: N/A Equipment/Devices: Per SNF Discharge Condition: Stable CODE STATUS: Full Diet recommendation: Heart healthy, carb-modified  Brief/Interim Summary: Nathan Pennington a 82 y.o.malewithhistory of hypertension, hypothyroidism, diabetes mellitus presented to the ER with complaints of persistent right hip pain after patient had a fall after having dinner. Patient stated he had gone with his family for dinner celebrating his birthday when he tripped and fell. Denied hitting his head or losing consciousness. He came to the ER where x-rays revealed right hip fracture and Dr. Berenice Primas on call orthopedic surgeon was consulted, performed right hip THA 10/12/2017. The patient will require ongoing rehabilitation prior to returning home.   Discharge Diagnoses:  Principal Problem:   Closed right hip fracture, initial encounter [facility]) Active Problems:   Diabetes mellitus type 2 in obese ([place])   Hypothyroidism   Essential hypertension   HLD (hyperlipidemia)  Right hip fracture sp mech fall: sp ORIF/ R THA on 2/1 by Dr Berenice Primas. XR the following day showed acetabular and femoral components are in good position.  - Continue aspirin 325mg  BID per orthopedics for DVT ppx x1 month. Will hold plavix as pt states he only takes this preventively, without compelling indication (no CAD or CVA).  - Pain control as ordered, continue bowel regimen.  - Follow up with orthopedics in 2 weeks - Weight-bear as tolerated on right lower extremity with posterior hip precautions.  Diabetes mellitus type 2: Restart home medications   Hypertension: Resume home medications  Hypothyroidism: Resume thyroid  replacement  Hist of L THA  Discharge Instructions Discharge Instructions    Weight bearing as tolerated   Complete by:  As directed    Laterality:  right   Extremity:  Lower     Allergies as of 10/15/2017   No Known Allergies     Medication List    STOP taking these medications   clopidogrel 75 MG tablet Commonly known as:  PLAVIX     TAKE these medications   ALPHAGAN P 0.1 % Soln Generic drug:  brimonidine Place 1 drop into both eyes daily.   aspirin EC 325 MG tablet Take 1 tablet (325 mg total) by mouth 2 (two) times daily. To decrease risk of blood clots. Take x 78month post op.   docusate sodium 100 MG capsule Commonly known as:  COLACE Take 1 capsule (100 mg total) by mouth 2 (two) times daily.   fenofibrate 48 MG tablet Commonly known as:  TRICOR Take 48 mg by mouth daily.   finasteride 5 MG tablet Commonly known as:  PROSCAR Take 5 mg by mouth daily.   FLAX SEEDS PO Take 1 capsule by mouth daily.   glipiZIDE 2.5 MG 24 hr tablet Commonly known as:  GLUCOTROL XL Take 2.5 mg by mouth daily.   HYDROcodone-acetaminophen 5-325 MG tablet Commonly known as:  NORCO Take 1 tablet by mouth every 6 (six) hours as needed for moderate pain.   latanoprost 0.005 % ophthalmic solution Commonly known as:  XALATAN Place 1 drop into both eyes at bedtime.   levothyroxine 75 MCG tablet Commonly known as:  SYNTHROID, LEVOTHROID Take 75 mcg by mouth daily.   lisinopril 2.5 MG tablet Commonly known as:  PRINIVIL,ZESTRIL Take 2.5 mg  by mouth daily.   meclizine 25 MG tablet Commonly known as:  ANTIVERT Take 25 mg by mouth as needed. Travel sickness   metFORMIN 1000 MG tablet Commonly known as:  GLUCOPHAGE Take 1,000 mg by mouth 2 (two) times daily.   omega-3 acid ethyl esters 1 g capsule Commonly known as:  LOVAZA Take 1 g by mouth 2 (two) times daily.   OVER THE COUNTER MEDICATION Take 1 capsule by mouth daily.   OVER THE COUNTER MEDICATION Take 1 capsule  by mouth at bedtime.   OVER THE COUNTER MEDICATION Take 1 Scoop by mouth daily.   PROVENTIL HFA 108 (90 Base) MCG/ACT inhaler Generic drug:  albuterol Inhale 2 puffs into the lungs every 6 (six) hours as needed for shortness of breath.   rosuvastatin 20 MG tablet Commonly known as:  CRESTOR Take 20 mg by mouth daily.   SYMBICORT 160-4.5 MCG/ACT inhaler Generic drug:  budesonide-formoterol Inhale 2 puffs into the lungs 2 (two) times daily.   tamsulosin 0.4 MG Caps capsule Commonly known as:  FLOMAX Take 0.4 mg by mouth daily.   tiZANidine 2 MG tablet Commonly known as:  ZANAFLEX Take 1 tablet (2 mg total) by mouth every 8 (eight) hours as needed for muscle spasms.            Discharge Care Instructions  (From admission, onward)        Start     Ordered   10/12/17 0000  Weight bearing as tolerated    Question Answer Comment  Laterality right   Extremity Lower      10/12/17 1810     Follow-up Information    Dorna Leitz, MD. Schedule an appointment as soon as possible for a visit in 2 week(s).   Specialty:  Orthopedic Surgery Contact information: [place] [address] [phone]          No Known Allergies  Consultations:  Orthopedics, Dr. Berenice Primas  Procedures/Studies: Dg Chest 2 View  Result Date: 10/11/2017 CLINICAL DATA:  82 y/o  M; status post fall.  Preop. EXAM: CHEST  2 VIEW COMPARISON:  None. FINDINGS: Normal cardiac silhouette. Aortic atherosclerosis with calcification. Clear lungs. No pleural effusion or pneumothorax. No acute osseous abnormality is evident. IMPRESSION: No acute pulmonary process identified. Electronically Signed   By: Kristine Garbe M.D.   On: 10/11/2017 23:27   Dg Pelvis 1-2 Views  Result Date: 10/13/2017 CLINICAL DATA:  Status post right hip replacement October 12, 2017. EXAM: PELVIS - 1-2 VIEW COMPARISON:  None. FINDINGS: Patient is status post right hip replacement. Acetabular and femoral  components are in good position. Soft tissue gas and skin staples consistent with interval surgery. No other acute abnormalities. IMPRESSION: Postoperative changes in the right hip. Electronically Signed   By: Dorise Bullion III M.D   On: 10/13/2017 09:03   Dg C-arm 1-60 Min-no Report  Result Date: 10/12/2017 Fluoroscopy was utilized by the requesting physician.  No radiographic interpretation.   Dg Hip Unilat  With Pelvis 2-3 Views Right  Result Date: 10/11/2017 CLINICAL DATA:  Generalized right hip pain after a fall. EXAM: DG HIP (WITH OR WITHOUT PELVIS) 2-3V RIGHT COMPARISON:  None. FINDINGS: Transverse fracture of the right femoral neck with varus angulation of the fracture fragments. No dislocation at the hip joint. Pelvis appears intact. Mild degenerative changes in the lower lumbar spine and both hips. IMPRESSION: Acute transverse fracture of the right femoral neck with varus angulation. Electronically Signed   By: Gwyndolyn Saxon  Gerilyn Nestle M.D.   On: 10/11/2017 22:36   Subjective: Pain is controlled. Has a wife and MIL at home, both require walkers for mobility themselves. Needs to be more independent before returning home. No dyspnea or chest pain.   Discharge Exam: BP (!) 135/53 (BP Location: Right Arm)   Pulse 90   Temp 98.2 F (36.8 C) (Oral)   Resp 14   Ht 6' (1.829 m)   Wt 103 kg (227 lb)   SpO2 95%   BMI 30.79 kg/m   General: Pt is alert, awake, not in acute distress Cardiovascular: RRR, S1/S2 +, no rubs, no gallops Respiratory: CTA bilaterally, no wheezing, no rhonchi Abdominal: Soft, NT, ND, bowel sounds + Extremities: RLE with incision c/d/i, soft thigh compartment, distal cap refill brisk, sensation normal and motor function 5/5.   Labs: Basic Metabolic Panel: Recent Labs  Lab 10/11/17 2327 10/13/17 1115  NA 141 136  K 3.7 3.8  CL 108 103  CO2 26 25  GLUCOSE 169* 192*  BUN 22* 12  CREATININE 1.03 0.87  CALCIUM 8.9 8.0*   Liver Function Tests: Recent Labs  Lab  10/11/17 2327  AST 20  ALT 13*  ALKPHOS 53  BILITOT 0.7  PROT 6.8  ALBUMIN 3.9   CBC: Recent Labs  Lab 10/11/17 2327 10/13/17 0607 10/14/17 0540 10/15/17 0558  WBC 16.2* 13.7* 12.3* 9.3  NEUTROABS 14.0*  --   --   --   HGB 15.1 11.2* 9.5* 8.8*  HCT 43.6 32.9* 28.0* 25.6*  MCV 95.4 95.9 94.6 95.2  PLT 242 143* 157 163   CBG: Recent Labs  Lab 10/14/17 1711 10/14/17 1923 10/15/17 0014 10/15/17 0510 10/15/17 0710  GLUCAP 153* 225* 141* 148* 130*    Microbiology Recent Results (from the past 240 hour(s))  Surgical PCR screen     Status: None   Collection Time: 10/12/17  6:48 AM  Result Value Ref Range Status   MRSA, PCR NEGATIVE NEGATIVE Final   Staphylococcus aureus NEGATIVE NEGATIVE Final    Comment: (NOTE) The Xpert SA Assay (FDA approved for NASAL specimens in patients 61 years of age and older), is one component of a comprehensive surveillance program. It is not intended to diagnose infection nor to guide or monitor treatment. Performed at [facility], [place] [address] [place], [place] [address]     Time coordinating discharge: Approximately 40 minutes  Vance Gather, MD  [facility] 10/15/2017, 9:56 AM Pager [phone]

## 2017-10-15 NOTE — Progress Notes (Signed)
Physical Therapy Treatment Patient Details Name: Nathan Pennington MRN: [id number] DOB: 1935/11/13 Today's Date: 10/15/2017    History of Present Illness 82 y.o. s/p Rt anterior THA 10/12/2017 due to fall was out to dinner with his family and fell at [place], drove home and crawled into the house) with fracture 10/10/2017 . PMH includes DM, HTN, and hypothyroidism.    PT Comments    POD # 3 am session Assisted OOB to amb an increased distance then returned to room to perform some THR TE's followed by ICE.  Pt progressing slowly and will need ST Rehab at SNF.   Follow Up Recommendations  SNF     Equipment Recommendations  Rolling walker with 5" wheels    Recommendations for Other Services       Precautions / Restrictions Precautions Precautions: Fall Restrictions Weight Bearing Restrictions: No RLE Weight Bearing: Weight bearing as tolerated    Mobility  Bed Mobility Overal bed mobility: Needs Assistance Bed Mobility: Supine to Sit     Supine to sit: Mod assist     General bed mobility comments: assist with RLE and trunk.  Used bed pad to complete scooting as pt was unable to self perform  Transfers Overall transfer level: Needs assistance Equipment used: Rolling walker (2 wheeled) Transfers: Sit to/from Stand Sit to Stand: +2 physical assistance;Mod assist Stand pivot transfers: +2 physical assistance;Max assist       General transfer comment: cues for RW hand placment and safety   Ambulation/Gait Ambulation/Gait assistance: +2 safety/equipment;Min assist Ambulation Distance (Feet): 62 Feet Assistive device: Rolling walker (2 wheeled) Gait Pattern/deviations: Step-through pattern Gait velocity: decreased   General Gait Details: tried to educated with step to pattern however pt was not able to comprehend this pattern, so he was performing step to pattern with small steps    Stairs            Wheelchair Mobility    Modified Rankin (Stroke Patients  Only)       Balance                                            Cognition Arousal/Alertness: Awake/alert Behavior During Therapy: WFL for tasks assessed/performed Overall Cognitive Status: Within Functional Limits for tasks assessed                                 General Comments: HOH      Exercises  10 reps B LE AP ans knee presses    General Comments        Pertinent Vitals/Pain Pain Assessment: 0-10 Pain Score: 6  Pain Location: R hip and anteriro thigh area Pain Descriptors / Indicators: Aching;Grimacing;Operative site guarding Pain Intervention(s): Monitored during session;Repositioned;Premedicated before session;Ice applied    Home Living                      Prior Function            PT Goals (current goals can now be found in the care plan section) Progress towards PT goals: Progressing toward goals    Frequency    Min 5X/week      PT Plan Current plan remains appropriate    Co-evaluation              AM-PAC PT "6  Clicks" Daily Activity  Outcome Measure  Difficulty turning over in bed (including adjusting bedclothes, sheets and blankets)?: A Lot Difficulty moving from lying on back to sitting on the side of the bed? : A Lot Difficulty sitting down on and standing up from a chair with arms (e.g., wheelchair, bedside commode, etc,.)?: A Lot Help needed moving to and from a bed to chair (including a wheelchair)?: A Lot Help needed walking in hospital room?: A Lot Help needed climbing 3-5 steps with a railing? : Total 6 Click Score: 11    End of Session Equipment Utilized During Treatment: Gait belt Activity Tolerance: Patient tolerated treatment well Patient left: with chair alarm set;in chair Nurse Communication: Mobility status PT Visit Diagnosis: Other abnormalities of gait and mobility (R26.89)     Time: 7847-8412 PT Time Calculation (min) (ACUTE ONLY): 24 min  Charges:  $Gait Training:  8-22 mins $Therapeutic Activity: 8-22 mins                    G Codes:       Rica Koyanagi  PTA [facility]  Acute  Rehab Pager      [phone]

## 2017-10-15 NOTE — NC FL2 (Signed)
[place] LEVEL OF CARE SCREENING TOOL     IDENTIFICATION  Patient Name: Nathan Pennington Birthdate: [birth year] Sex: male Admission Date (Current Location): 10/11/2017  [facility] and [place] Number:  Herbalist and Address:  [facility],  [place], [place]      Provider Number: [id number]  Attending Physician Name and Address:  Patrecia Pour, MD  Relative Name and Phone Number:       Current Level of Care: Hospital Recommended Level of Care: [place] Prior Approval Number:    Date Approved/Denied:   PASRR Number:   [id number]   Discharge Plan: SNF    Current Diagnoses: Patient Active Problem List   Diagnosis Date Noted  . Closed right hip fracture, initial encounter ([place]) 10/12/2017  . Diabetes mellitus type 2 in obese ([place]) 10/12/2017  . Hypothyroidism 10/12/2017  . Essential hypertension 10/12/2017  . HLD (hyperlipidemia) 10/12/2017    Orientation RESPIRATION BLADDER Height & Weight     Self, Time, Situation, Place  Normal Continent Weight: 227 lb (103 kg) Height:  6' (182.9 cm)  BEHAVIORAL SYMPTOMS/MOOD NEUROLOGICAL BOWEL NUTRITION STATUS      Continent Diet(Carb modified)  AMBULATORY STATUS COMMUNICATION OF NEEDS Skin   Extensive Assist Verbally (Right Hip, Surgical Insision.)                       Personal Care Assistance Level of Assistance  Bathing, Feeding, Dressing Bathing Assistance: Limited assistance Feeding assistance: Independent Dressing Assistance: Limited assistance     Functional Limitations Info  Sight, Hearing, Speech Sight Info: Adequate Hearing Info: Adequate Speech Info: Adequate    SPECIAL CARE FACTORS FREQUENCY  PT (By licensed PT), OT (By licensed OT)     PT Frequency: 5x OT Frequency: 5x            Contractures Contractures Info: Not present    Additional Factors Info  Code Status, Allergies, Psychotropic, Insulin Sliding Scale Code  Status Info: Fullcode Allergies Info: Allergies: No Known Allergies   Insulin Sliding Scale Info: 0-15untis every for hours       Current Medications (10/15/2017):  This is the current hospital active medication list Current Facility-Administered Medications  Medication Dose Route Frequency Provider Last Rate Last Dose  . 0.9 %  sodium chloride infusion   Intravenous Continuous Ousman, Dise, PA-C 75 mL/hr at 10/13/17 2155    . acetaminophen (TYLENOL) tablet 650 mg  650 mg Oral Q6H PRN Gary Fleet, PA-C       Or  . acetaminophen (TYLENOL) suppository 650 mg  650 mg Rectal Q6H PRN Gary Fleet, PA-C      . alum & mag hydroxide-simeth (MAALOX/MYLANTA) 200-200-20 MG/5ML suspension 30 mL  30 mL Oral Q4H PRN Gary Fleet, PA-C      . aspirin EC tablet 325 mg  325 mg Oral Q breakfast Tosh, Glaze, PA-C   325 mg at 10/15/17 0734  . bisacodyl (DULCOLAX) EC tablet 5 mg  5 mg Oral Daily PRN Gary Fleet, PA-C   5 mg at 10/13/17 1025  . brimonidine (ALPHAGAN) 0.15 % ophthalmic solution 1 drop  1 drop Both Eyes Daily Rise Patience, MD   1 drop at 10/15/17 1002  . docusate sodium (COLACE) capsule 100 mg  100 mg Oral BID Bartow, Zylstra, PA-C   100 mg at 10/15/17 1000  . hydrALAZINE (APRESOLINE) injection 10 mg  10 mg Intravenous Q4H PRN Rise Patience, MD      .  HYDROcodone-acetaminophen (NORCO/VICODIN) 5-325 MG per tablet 1-2 tablet  1-2 tablet Oral Q6H PRN Gary Fleet, PA-C   1 tablet at 10/15/17 1000  . insulin aspart (novoLOG) injection 0-15 Units  0-15 Units Subcutaneous Q4H Roney Jaffe, MD   2 Units at 10/15/17 [phone]  . latanoprost (XALATAN) 0.005 % ophthalmic solution 1 drop  1 drop Both Eyes QHS Rise Patience, MD   1 drop at 10/14/17 2138  . levothyroxine (SYNTHROID, LEVOTHROID) tablet 75 mcg  75 mcg Oral QAC breakfast Glenn, [place], PA-C   75 mcg at 10/15/17 0734  . lisinopril (PRINIVIL,ZESTRIL) tablet 2.5 mg  2.5 mg Oral Daily Roney Jaffe, MD   2.5 mg at  10/15/17 1000  . methocarbamol (ROBAXIN) tablet 500 mg  500 mg Oral Q6H PRN Gary Fleet, PA-C   500 mg at 10/15/17 0200   Or  . methocarbamol (ROBAXIN) 500 mg in dextrose 5 % 50 mL IVPB  500 mg Intravenous Q6H PRN Gary Fleet, PA-C   Stopped at 10/13/17 0055  . mometasone-formoterol (DULERA) 200-5 MCG/ACT inhaler 2 puff  2 puff Inhalation BID Rise Patience, MD   2 puff at 10/15/17 0908  . morphine 2 MG/ML injection 0.5-1 mg  0.5-1 mg Intravenous Q2H PRN Roney Jaffe, MD      . ondansetron [facility]) tablet 4 mg  4 mg Oral Q6H PRN Gary Fleet, PA-C       Or  . ondansetron [facility]) injection 4 mg  4 mg Intravenous Q6H PRN Gary Fleet, PA-C      . polyethylene glycol (MIRALAX / GLYCOLAX) packet 17 g  17 g Oral Daily PRN Gary Fleet, PA-C      . tamsulosin (FLOMAX) capsule 0.4 mg  0.4 mg Oral Daily Soham, Hollett, PA-C   0.4 mg at 10/15/17 1000  . zolpidem (AMBIEN) tablet 5 mg  5 mg Oral QHS PRN Gary Fleet, PA-C         Discharge Medications: Please see discharge summary for a list of discharge medications.  Relevant Imaging Results:  Relevant Lab Results:   Additional Information ssn:[id number]  Lia Hopping, LCSW

## 2017-10-15 NOTE — Clinical Social Work Placement (Signed)
D/C summary sent.  Nurse given number for report.  [facility] called to transport. ETA: 1 hour HTA authorization received: [id number]  CLINICAL SOCIAL WORK PLACEMENT  NOTE  Date:  10/15/2017  Patient Details  Name: Nathan Pennington MRN: [id number] Date of Birth: 1936/06/16  Clinical Social Work is seeking post-discharge placement for this patient at the [place] level of care (*CSW will initial, date and re-position this form in  chart as items are completed):  Yes   Patient/family provided with [place] Work Department's list of facilities offering this level of care within the geographic area requested by the patient (or if unable, by the patient's family).  Yes   Patient/family informed of their freedom to choose among providers that offer the needed level of care, that participate in Medicare, Medicaid or managed care program needed by the patient, have an available bed and are willing to accept the patient.  Yes   Patient/family informed of [facility]'s ownership interest in [facility] and [facility], as well as of the fact that they are under no obligation to receive care at these facilities.  PASRR submitted to EDS on 10/15/17     PASRR number received on 10/15/17     Existing PASRR number confirmed on       FL2 transmitted to all facilities in geographic area requested by pt/family on       FL2 transmitted to all facilities within larger geographic area on 10/15/17     Patient informed that his/her managed care company has contracts with or will negotiate with certain facilities, including the following:            Patient/family informed of bed offers received.  Patient chooses bed at [facility]     Physician recommends and patient chooses bed at      Patient to be transferred to [facility] on 10/15/17.  Patient to be transferred to facility by [facility]      Patient family notified on 10/15/17 of  transfer.  Name of family member notified:  Son-Chuck     PHYSICIAN Please prepare priority discharge summary, including medications     Additional Comment:    _______________________________________________ Lia Hopping, LCSW 10/15/2017, 1:57 PM

## 2017-10-15 NOTE — Care Management Important Message (Signed)
Important Message  Patient Details  Name: Johnathin Vanderschaaf MRN: [id number] Date of Birth: 1936/01/30   Medicare Important Message Given:  Yes    Kerin Salen 10/15/2017, 9:38 AMImportant Message  Patient Details  Name: Zandon Talton MRN: [id number] Date of Birth: [birth year]   Medicare Important Message Given:  Yes    Kerin Salen 10/15/2017, 9:38 AM

## 2017-10-15 NOTE — Clinical Social Work Note (Signed)
Clinical Social Work Assessment  Patient Details  Name: Nathan Pennington MRN: [id number] Date of Birth: [birth year]  Date of referral:  10/15/17               Reason for consult:                   Permission sought to share information with:  Family Supports Permission granted to share information::  Yes, Verbal Permission Granted  Name::        Agency::  SNF   Relationship::  Son-Chuck   Contact Information:     Housing/Transportation Living arrangements for the past 2 months:  Single Family Home Source of Information:  Patient Patient Interpreter Needed:  None Criminal Activity/Legal Involvement Pertinent to Current Situation/Hospitalization:    Significant Relationships:  None Lives with:  Spouse Do you feel safe going back to the place where you live?  Yes Need for family participation in patient care:  Yes (Comment)  Care giving concerns:   Patient was admitted after a fall, underwent surgical procedure. Patient was evaluated by physical therapy and recommend short rehab.   Social Worker assessment / plan:  CSW met with the patient at bedside, explained role and reason for visit-to assist with discharge to SNF facility.   Patient reports he lives at home with his wife and feels that he is independent. He reports his spouse is currently using a walker and has difficulty walking therefore she is unable to care for him at home at this time.  CSW explain faxing out process and provided bed offers.   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   Patient/Family's Understanding of and Emotional Response to Diagnosis, Current Treatment, and Prognosis:  "I am able bodied and have a good mind." Patient alert and oriented  x4, reports understanding his diagnosis and need for follow up care at SNF before returning home.   Emotional Assessment Appearance:  Appears stated age Attitude/Demeanor/Rapport:    Affect (typically observed):  Accepting, Pleasant Orientation:  Oriented to Self, Oriented to Place, Oriented to  Time, Oriented to Situation Alcohol / Substance use:  Not Applicable Psych involvement (Current and /or in the community):  No (Comment)  Discharge Needs  Concerns to be addressed:  Discharge Planning Concerns, Decision making concerns Readmission within the last 30 days:  No Current discharge risk:  Dependent with Mobility Barriers to Discharge:  No Barriers Identified   Lia Hopping, LCSW 10/15/2017, 2:02 PM

## 2017-10-16 ENCOUNTER — Non-Acute Institutional Stay (SKILLED_NURSING_FACILITY): Payer: PPO | Admitting: Internal Medicine

## 2017-10-16 ENCOUNTER — Encounter: Payer: Self-pay | Admitting: Internal Medicine

## 2017-10-16 DIAGNOSIS — I10 Essential (primary) hypertension: Secondary | ICD-10-CM | POA: Diagnosis not present

## 2017-10-16 DIAGNOSIS — E669 Obesity, unspecified: Secondary | ICD-10-CM

## 2017-10-16 DIAGNOSIS — S72001A Fracture of unspecified part of neck of right femur, initial encounter for closed fracture: Secondary | ICD-10-CM | POA: Diagnosis not present

## 2017-10-16 DIAGNOSIS — E782 Mixed hyperlipidemia: Secondary | ICD-10-CM

## 2017-10-16 DIAGNOSIS — D62 Acute posthemorrhagic anemia: Secondary | ICD-10-CM | POA: Diagnosis not present

## 2017-10-16 DIAGNOSIS — E034 Atrophy of thyroid (acquired): Secondary | ICD-10-CM

## 2017-10-16 DIAGNOSIS — E1169 Type 2 diabetes mellitus with other specified complication: Secondary | ICD-10-CM

## 2017-10-16 NOTE — Progress Notes (Signed)
: Provider:  Noah Delaine. Sheppard Coil, MD Location:  Uncertain Room Number: 644I Place of Service:  SNF (31)  PCP: System, Pcp Not In Patient Care Team: System, Pcp Not In as PCP - General  Extended Emergency Contact Information Primary Emergency Contact: Bridgers,Mary Home Phone: [phone] Relation: Spouse     Allergies: Patient has no known allergies.  Chief Complaint  Patient presents with  . New Admit To SNF    Admit to Facility    HPI: Patient is 82 y.o. male with hypertension, hypothyroidism, and diabetes mellitus who presented to [facility] ED complaining of hip pain, onset last night after a fall after having dinner for his birthday. Patient denies hitting his head or losing consciousness, no chest pain or shortness of breath or palpitations. He went home after fall but ever since the pain worsened so he came to the emergency department. Patient was admitted to [facility] from 1/31-2/4 where he was treated for a right hip fracture. There were no reported complications. Patient is admitted to skilled nursing facility for OT/PT. While at skilled nursing facility patient will be followed for diabetes type 2 treated with glipizide and metformin, hypertension treated with lisinopril and hypothyroidism treated with Synthroid.  Past Medical History:  Diagnosis Date  . Amblyopia   . Asthma   . Cataract   . COPD (chronic obstructive pulmonary disease) ([place])   . Diabetes mellitus without complication ([place])    TYPE 2  . Disease of thyroid gland   . Glaucoma   . History of TIA (transient ischemic attack)   . Hyperlipidemia   . Hypertension   . Hypothyroidism   . Hypothyroidism   . Peripheral neuropathy   . Prostate disease   . Retinal arterial branch occlusion, right   . TIA (transient ischemic attack)     Past Surgical History:  Procedure Laterality Date  . COLONOSCOPY  2004  . COLONOSCOPY W/ BIOPSIES Left 01/22/2016   Procedure:  COLONOSCOPY, FLEXIBLE, PROXIMAL TO SPLENIC FLEXURE; WITH BIOPSY, SINGLE OR MULTIPLE; Surgeon: Darlyne Russian, MD; Location: [facility]; Service: Gastroenterology  . HERNIA REPAIR    . PAROTIDECTOMY    . TONSILLECTOMY    . TOTAL HIP ARTHROPLASTY Right 10/12/2017   Procedure: TOTAL HIP ARTHROPLASTY ANTERIOR APPROACH;  Surgeon: Dorna Leitz, MD;  Location: [facility];  Service: Orthopedics;  Laterality: Right;  . UMBILICAL HERNIA REPAIR     AGE 35  . UPPER GASTROINTESTINAL ENDOSCOPY Left 01/22/2016   Procedure: UGI ENDO, INCLUDE ESOPHAGUS, STOMACH, & DUODENUM &/OR JEJUNUM; DX W/WO COLLECTION SPECIMN, BY BIOPSY; Surgeon: Darlyne Russian, MD; Location: [facility]; Service: Gastroenterology    Allergies as of 10/16/2017   No Known Allergies     Medication List        Accurate as of 10/16/17  9:29 AM. Always use your most recent med list.          ALPHAGAN P 0.1 % Soln Generic drug:  brimonidine Place 1 drop into both eyes daily.   aspirin EC 325 MG tablet Take 1 tablet (325 mg total) by mouth 2 (two) times daily. To decrease risk of blood clots. Take x 12month post op.   docusate sodium 100 MG capsule Commonly known as:  COLACE Take 1 capsule (100 mg total) by mouth 2 (two) times daily.   fenofibrate 48 MG tablet Commonly known as:  TRICOR Take 48 mg by mouth daily.   finasteride 5 MG tablet Commonly  known as:  PROSCAR Take 5 mg by mouth daily.   FLAX SEEDS PO Take 1 capsule by mouth daily.   glipiZIDE 2.5 MG 24 hr tablet Commonly known as:  GLUCOTROL XL Take 2.5 mg by mouth daily.   HYDROcodone-acetaminophen 5-325 MG tablet Commonly known as:  NORCO Take 1 tablet by mouth every 6 (six) hours as needed for moderate pain.   latanoprost 0.005 % ophthalmic solution Commonly known as:  XALATAN Place 1 drop into both eyes at bedtime.   levothyroxine 75 MCG tablet Commonly known as:  SYNTHROID, LEVOTHROID Take 75 mcg by mouth daily.   lisinopril 2.5 MG  tablet Commonly known as:  PRINIVIL,ZESTRIL Take 2.5 mg by mouth daily.   meclizine 25 MG tablet Commonly known as:  ANTIVERT Take 25 mg by mouth as needed. Travel sickness   metFORMIN 1000 MG tablet Commonly known as:  GLUCOPHAGE Take 1,000 mg by mouth 2 (two) times daily.   omega-3 acid ethyl esters 1 g capsule Commonly known as:  LOVAZA Take 1 g by mouth 2 (two) times daily.   OVER THE COUNTER MEDICATION Take 1 capsule by mouth daily. Zypan digestive tablet   OVER THE COUNTER MEDICATION Take 1 capsule by mouth at bedtime. Optiprostate   OVER THE COUNTER MEDICATION Take 1 Scoop by mouth daily. Reds powder   PROVENTIL HFA 108 (90 Base) MCG/ACT inhaler Generic drug:  albuterol Inhale 2 puffs into the lungs every 6 (six) hours as needed for shortness of breath.   rosuvastatin 20 MG tablet Commonly known as:  CRESTOR Take 20 mg by mouth daily.   SYMBICORT 160-4.5 MCG/ACT inhaler Generic drug:  budesonide-formoterol Inhale 2 puffs into the lungs 2 (two) times daily.   tamsulosin 0.4 MG Caps capsule Commonly known as:  FLOMAX Take 0.4 mg by mouth daily.   tiZANidine 2 MG tablet Commonly known as:  ZANAFLEX Take 1 tablet (2 mg total) by mouth every 8 (eight) hours as needed for muscle spasms.       No orders of the defined types were placed in this encounter.   Immunization History  Administered Date(s) Administered  . Influenza, High Dose Seasonal PF 06/03/2015, 06/03/2015  . Influenza, Seasonal, Injecte, Preservative Fre 07/10/2012  . Influenza,inj,Quad PF,6+ Mos 05/30/2016  . Influenza-Unspecified 07/10/2012, 06/14/2014, 06/14/2014, 05/30/2016, 06/21/2017  . Pneumococcal Conjugate-13 06/03/2015, 06/03/2015  . Pneumococcal Polysaccharide-23 09/11/1997, 09/11/1997, 09/01/2008, 09/01/2008  . Td 09/30/2007  . Tdap 09/30/2007  . Zoster 11/08/2010, 11/08/2010    Social History   Tobacco Use  . Smoking status: Former Smoker    Types: Cigarettes    Last  attempt to quit: 01/21/1976    Years since quitting: 41.7  . Smokeless tobacco: Never Used  Substance Use Topics  . Alcohol use: Yes    Frequency: Never    Comment: occasional    Family history is   Family History  Problem Relation Age of Onset  . Cancer Mother   . Diabetes Mellitus II Father   . Hypertension Father   . Heart disease Father   . Glaucoma Father   . Macular degeneration Neg Hx       Review of Systems  DATA OBTAINED: from patient, nurse GENERAL:  no fevers, fatigue, appetite changes SKIN: No itching, or rash EYES: No eye pain, redness, discharge EARS: No earache, tinnitus, change in hearing NOSE: No congestion, drainage or bleeding  MOUTH/THROAT: No mouth or tooth pain, No sore throat RESPIRATORY: No cough, wheezing, SOB CARDIAC: No chest pain, palpitations,  lower extremity edema  GI: No abdominal pain, No N/V/D or constipation, No heartburn or reflux  GU: No dysuria, frequency or urgency, or incontinence  MUSCULOSKELETAL: No unrelieved bone/joint pain NEUROLOGIC: No headache, dizziness or focal weakness PSYCHIATRIC: No c/o anxiety or sadness   Vitals:   10/16/17 0854  BP: 131/73  Pulse: 90  Resp: 16  Temp: 98.3 F (36.8 C)  SpO2: 92%    SpO2 Readings from Last 1 Encounters:  10/16/17 92%   Body mass index is 30.79 kg/m.     Physical Exam  GENERAL APPEARANCE: Alert, conversant,  No acute distress.  SKIN: Incision area without abnormal heat or swelling HEAD: Normocephalic, atraumatic  EYES: Conjunctiva/lids clear. Pupils round, reactive. EOMs intact.  EARS: External exam WNL, canals clear. Hearing grossly normal.  NOSE: No deformity or discharge.  MOUTH/THROAT: Lips w/o lesions  RESPIRATORY: Breathing is even, unlabored. Lung sounds are clear   CARDIOVASCULAR: Heart RRR no murmurs, rubs or gallops. No peripheral edema.   GASTROINTESTINAL: Abdomen is soft, non-tender, not distended w/ normal bowel sounds. GENITOURINARY: Bladder non  tender, not distended  MUSCULOSKELETAL: No abnormal joints or musculature NEUROLOGIC:  Cranial nerves 2-12 grossly intact. Moves all extremities  PSYCHIATRIC: Mood and affect appropriate to situation, no behavioral issues  Patient Active Problem List   Diagnosis Date Noted  . Closed right hip fracture, initial encounter ([place]) 10/12/2017  . Diabetes mellitus type 2 in obese ([place]) 10/12/2017  . Hypothyroidism 10/12/2017  . Essential hypertension 10/12/2017  . HLD (hyperlipidemia) 10/12/2017      Labs reviewed: Basic Metabolic Panel:    Component Value Date/Time   NA 136 10/13/2017 1115   K 3.8 10/13/2017 1115   CL 103 10/13/2017 1115   CO2 25 10/13/2017 1115   GLUCOSE 192 (H) 10/13/2017 1115   BUN 12 10/13/2017 1115   CREATININE 0.87 10/13/2017 1115   CALCIUM 8.0 (L) 10/13/2017 1115   PROT 6.8 10/11/2017 2327   ALBUMIN 3.9 10/11/2017 2327   AST 20 10/11/2017 2327   ALT 13 (L) 10/11/2017 2327   ALKPHOS 53 10/11/2017 2327   BILITOT 0.7 10/11/2017 2327   GFRNONAA >60 10/13/2017 1115   GFRAA >60 10/13/2017 1115    Recent Labs    10/11/17 2327 10/13/17 1115  NA 141 136  K 3.7 3.8  CL 108 103  CO2 26 25  GLUCOSE 169* 192*  BUN 22* 12  CREATININE 1.03 0.87  CALCIUM 8.9 8.0*   Liver Function Tests: Recent Labs    10/11/17 2327  AST 20  ALT 13*  ALKPHOS 53  BILITOT 0.7  PROT 6.8  ALBUMIN 3.9   No results for input(s): LIPASE, AMYLASE in the last 8760 hours. No results for input(s): AMMONIA in the last 8760 hours. CBC: Recent Labs    10/11/17 2327 10/13/17 0607 10/14/17 0540 10/15/17 0558  WBC 16.2* 13.7* 12.3* 9.3  NEUTROABS 14.0*  --   --   --   HGB 15.1 11.2* 9.5* 8.8*  HCT 43.6 32.9* 28.0* 25.6*  MCV 95.4 95.9 94.6 95.2  PLT 242 143* 157 163   Lipid No results for input(s): CHOL, HDL, LDLCALC, TRIG in the last 8760 hours.  Cardiac Enzymes: No results for input(s): CKTOTAL, CKMB, CKMBINDEX, TROPONINI in the last 8760 hours. BNP: No results  for input(s): BNP in the last 8760 hours. No results found for: MICROALBUR No results found for: HGBA1C No results found for: TSH No results found for: VITAMINB12 No results found for: FOLATE No results  found for: IRON, TIBC, FERRITIN  Imaging and Procedures obtained prior to SNF admission: Dg Chest 2 View  Result Date: 10/11/2017 CLINICAL DATA:  82 y/o  M; status post fall.  Preop. EXAM: CHEST  2 VIEW COMPARISON:  None. FINDINGS: Normal cardiac silhouette. Aortic atherosclerosis with calcification. Clear lungs. No pleural effusion or pneumothorax. No acute osseous abnormality is evident. IMPRESSION: No acute pulmonary process identified. Electronically Signed   By: Kristine Garbe M.D.   On: 10/11/2017 23:27   Dg C-arm 1-60 Min-no Report  Result Date: 10/12/2017 Fluoroscopy was utilized by the requesting physician.  No radiographic interpretation.   Dg Hip Unilat  With Pelvis 2-3 Views Right  Result Date: 10/11/2017 CLINICAL DATA:  Generalized right hip pain after a fall. EXAM: DG HIP (WITH OR WITHOUT PELVIS) 2-3V RIGHT COMPARISON:  None. FINDINGS: Transverse fracture of the right femoral neck with varus angulation of the fracture fragments. No dislocation at the hip joint. Pelvis appears intact. Mild degenerative changes in the lower lumbar spine and both hips. IMPRESSION: Acute transverse fracture of the right femoral neck with varus angulation. Electronically Signed   By: Lucienne Capers M.D.   On: 10/11/2017 22:36     Not all labs, radiology exams or other studies done during hospitalization come through on my EPIC note; however they are reviewed by me.    Assessment and Plan  Right hip fracture/status post ORIF ASA twice a day 1 month for prophylaxis, Plavix stopped because patient has no indication for using it SNF -admitted for OT/PT; continue prophylaxis with ASA 325 mg twice a day 1 month  Acute blood loss anemia/postop SNF - preop hemoglobin 15.1, postop  hemoglobin 8.8, no history patient received transfusion; will follow-up CBC  Diabetes mellitus  SNF - not stated as uncontrolled; continue glipizide 2.5 mg daily, and metformin 1000 mg twice a day; patient is on an ACE and statin  Hypertension SNF - controlled; continue lisinopril 2.5 mg by mouth daily  Hypothyroidism SNF - not stated as uncontrolled; continue Synthroid 75 g by mouth daily  Hyperlipidemia SNF - not stated as uncontrolled; continue fenofibrate 48 mg by mouth daily, Crestor 20 mg by mouth daily and omega-3 fatty acids 1 g 2 times daily   Time spent greater than 45 minutes;> 50% of time with patient was spent reviewing records, labs, tests and studies, counseling and developing plan of care  Webb Silversmith D. Sheppard Coil, MD

## 2017-10-17 ENCOUNTER — Encounter: Payer: Self-pay | Admitting: Internal Medicine

## 2017-10-17 DIAGNOSIS — D62 Acute posthemorrhagic anemia: Secondary | ICD-10-CM | POA: Insufficient documentation

## 2017-10-19 LAB — BASIC METABOLIC PANEL
BUN: 17 (ref 4–21)
Creatinine: 0.6 (ref 0.6–1.3)
Glucose: 160
Potassium: 4.4 (ref 3.4–5.3)
Sodium: 140 (ref 137–147)

## 2017-10-19 LAB — CBC AND DIFFERENTIAL
HEMATOCRIT: 29 — AB (ref 41–53)
Hemoglobin: 9.7 — AB (ref 13.5–17.5)
PLATELETS: 381 (ref 150–399)
WBC: 10.8

## 2017-10-25 DIAGNOSIS — M25551 Pain in right hip: Secondary | ICD-10-CM | POA: Diagnosis not present

## 2017-10-25 DIAGNOSIS — Z96641 Presence of right artificial hip joint: Secondary | ICD-10-CM | POA: Diagnosis not present

## 2017-10-25 DIAGNOSIS — T148XXA Other injury of unspecified body region, initial encounter: Secondary | ICD-10-CM | POA: Diagnosis not present

## 2017-10-28 ENCOUNTER — Other Ambulatory Visit: Payer: Self-pay

## 2017-10-28 ENCOUNTER — Emergency Department (HOSPITAL_COMMUNITY)
Admission: EM | Admit: 2017-10-28 | Discharge: 2017-10-29 | Disposition: A | Discharge status: Home / Self Care | Payer: PPO | Attending: Emergency Medicine | Admitting: Emergency Medicine

## 2017-10-28 DIAGNOSIS — Z96641 Presence of right artificial hip joint: Secondary | ICD-10-CM | POA: Insufficient documentation

## 2017-10-28 DIAGNOSIS — L7622 Postprocedural hemorrhage and hematoma of skin and subcutaneous tissue following other procedure: Secondary | ICD-10-CM | POA: Insufficient documentation

## 2017-10-28 DIAGNOSIS — J449 Chronic obstructive pulmonary disease, unspecified: Secondary | ICD-10-CM | POA: Diagnosis not present

## 2017-10-28 DIAGNOSIS — Z87891 Personal history of nicotine dependence: Secondary | ICD-10-CM | POA: Diagnosis not present

## 2017-10-28 DIAGNOSIS — E039 Hypothyroidism, unspecified: Secondary | ICD-10-CM | POA: Diagnosis not present

## 2017-10-28 DIAGNOSIS — Z7982 Long term (current) use of aspirin: Secondary | ICD-10-CM | POA: Insufficient documentation

## 2017-10-28 DIAGNOSIS — I10 Essential (primary) hypertension: Secondary | ICD-10-CM | POA: Insufficient documentation

## 2017-10-28 DIAGNOSIS — J45909 Unspecified asthma, uncomplicated: Secondary | ICD-10-CM | POA: Insufficient documentation

## 2017-10-28 DIAGNOSIS — Z471 Aftercare following joint replacement surgery: Secondary | ICD-10-CM | POA: Diagnosis not present

## 2017-10-28 DIAGNOSIS — Z7984 Long term (current) use of oral hypoglycemic drugs: Secondary | ICD-10-CM | POA: Diagnosis not present

## 2017-10-28 DIAGNOSIS — Z79899 Other long term (current) drug therapy: Secondary | ICD-10-CM | POA: Diagnosis not present

## 2017-10-28 DIAGNOSIS — S71001A Unspecified open wound, right hip, initial encounter: Secondary | ICD-10-CM

## 2017-10-28 DIAGNOSIS — E119 Type 2 diabetes mellitus without complications: Secondary | ICD-10-CM | POA: Diagnosis not present

## 2017-10-28 DIAGNOSIS — Z7901 Long term (current) use of anticoagulants: Secondary | ICD-10-CM | POA: Diagnosis not present

## 2017-10-28 NOTE — ED Notes (Signed)
Bed: WA14 Expected date:  Expected time:  Means of arrival:  Comments: EMS 

## 2017-10-28 NOTE — Discharge Instructions (Signed)
NEED TO STOP PLAVIX.  SHOULD ONLY BE TAKING ASPIRIN FOR DVT PROPHYLAXIS.  Keep wound dry and make sure clean bandage.

## 2017-10-28 NOTE — ED Provider Notes (Signed)
[facility] DEPT Provider Note   CSN: [id number] Arrival date & time: 10/28/17  2149     History   Chief Complaint Chief Complaint  Patient presents with  . Bleeding from surgical site    HPI Soren Lazarz is a 82 y.o. male.  Patient is an 82 year old male with a history of diabetes, COPD, asthma, TIA who recently had a hip replacement after a fall 2 weeks ago presenting today with bleeding from his surgical incision.  Patient states 2 days ago started noticing bleeding from the site.  Followed up with his orthopedist Dr. Berenice Primas and had a blood removed with a needle from the incision area.  Patient states he was also started on antibiotic because there was some concern for possible infection.  He states there is just been more bleeding on his dressing today.  He is not having increased pain, fever or other complaints.  Patient does take aspirin and Plavix since being at rehab.  Because the bleeding had continued they sent him here for further evaluation.  He denies feeling weak or dizzy.   The history is provided by the patient and the nursing home.    Past Medical History:  Diagnosis Date  . Amblyopia   . Asthma   . Cataract   . COPD (chronic obstructive pulmonary disease) ([place])   . Diabetes mellitus without complication ([place])    TYPE 2  . Disease of thyroid gland   . Glaucoma   . History of TIA (transient ischemic attack)   . Hyperlipidemia   . Hypertension   . Hypothyroidism   . Hypothyroidism   . Peripheral neuropathy   . Prostate disease   . Retinal arterial branch occlusion, right   . TIA (transient ischemic attack)     Patient Active Problem List   Diagnosis Date Noted  . Acute blood loss as cause of postoperative anemia 10/17/2017  . Closed right hip fracture, initial encounter ([place]) 10/12/2017  . Diabetes mellitus type 2 in obese ([place]) 10/12/2017  . Hypothyroidism 10/12/2017  . Essential hypertension 10/12/2017  . HLD  (hyperlipidemia) 10/12/2017    Past Surgical History:  Procedure Laterality Date  . COLONOSCOPY  2004  . COLONOSCOPY W/ BIOPSIES Left 01/22/2016   Procedure: COLONOSCOPY, FLEXIBLE, PROXIMAL TO SPLENIC FLEXURE; WITH BIOPSY, SINGLE OR MULTIPLE; Surgeon: Darlyne Russian, MD; Location: [facility]; Service: Gastroenterology  . HERNIA REPAIR    . PAROTIDECTOMY    . TONSILLECTOMY    . TOTAL HIP ARTHROPLASTY Right 10/12/2017   Procedure: TOTAL HIP ARTHROPLASTY ANTERIOR APPROACH;  Surgeon: Dorna Leitz, MD;  Location: [facility];  Service: Orthopedics;  Laterality: Right;  . UMBILICAL HERNIA REPAIR     AGE 28  . UPPER GASTROINTESTINAL ENDOSCOPY Left 01/22/2016   Procedure: UGI ENDO, INCLUDE ESOPHAGUS, STOMACH, & DUODENUM &/OR JEJUNUM; DX W/WO COLLECTION SPECIMN, BY BIOPSY; Surgeon: Darlyne Russian, MD; Location: [facility]; Service: Gastroenterology       Home Medications    Prior to Admission medications   Medication Sig Start Date End Date Taking? Authorizing Provider  albuterol (PROVENTIL HFA) 108 (90 Base) MCG/ACT inhaler Inhale 2 puffs into the lungs every 6 (six) hours as needed for shortness of breath. 05/17/16   [provider]  ALPHAGAN P 0.1 % SOLN Place 1 drop into both eyes daily. 09/06/17   [provider]  aspirin EC 325 MG tablet Take 1 tablet (325 mg total) by mouth 2 (two) times daily. To decrease  risk of blood clots. Take x 68month post op. 10/15/17 11/14/17  Patrecia Pour, MD  budesonide-formoterol (SYMBICORT) 160-4.5 MCG/ACT inhaler Inhale 2 puffs into the lungs 2 (two) times daily. 04/02/17 06/28/18  [provider]  docusate sodium (COLACE) 100 MG capsule Take 1 capsule (100 mg total) by mouth 2 (two) times daily. 10/12/17   Gary Fleet, PA-C  fenofibrate (TRICOR) 48 MG tablet Take 48 mg by mouth daily. 09/26/17   [provider]  finasteride (PROSCAR) 5 MG tablet Take 5 mg by mouth daily. 10/06/17   [provider]  Flaxseed,  Linseed, (FLAX SEEDS PO) Take 1 capsule by mouth daily.    [provider]  glipiZIDE (GLUCOTROL XL) 2.5 MG 24 hr tablet Take 2.5 mg by mouth daily. 08/24/15   [provider]  HYDROcodone-acetaminophen (NORCO) 5-325 MG tablet Take 1 tablet by mouth every 6 (six) hours as needed for moderate pain. 10/12/17   Gary Fleet, PA-C  latanoprost (XALATAN) 0.005 % ophthalmic solution Place 1 drop into both eyes at bedtime.    [provider]  levothyroxine (SYNTHROID, LEVOTHROID) 75 MCG tablet Take 75 mcg by mouth daily. 10/31/16 10/31/17  [provider]  lisinopril (PRINIVIL,ZESTRIL) 2.5 MG tablet Take 2.5 mg by mouth daily. 07/23/17   [provider]  meclizine (ANTIVERT) 25 MG tablet Take 25 mg by mouth as needed. Travel sickness 03/07/17   [provider]  metFORMIN (GLUCOPHAGE) 1000 MG tablet Take 1,000 mg by mouth 2 (two) times daily. 08/20/17   [provider]  omega-3 acid ethyl esters (LOVAZA) 1 g capsule Take 1 g by mouth 2 (two) times daily.    [provider]  OVER THE COUNTER MEDICATION Take 1 capsule by mouth daily. Zypan digestive tablet    [provider]  OVER THE COUNTER MEDICATION Take 1 capsule by mouth at bedtime. Optiprostate    [provider]  OVER THE COUNTER MEDICATION Take 1 Scoop by mouth daily. Reds powder    [provider]  rosuvastatin (CRESTOR) 20 MG tablet Take 20 mg by mouth daily. 09/03/17   [provider]  tamsulosin (FLOMAX) 0.4 MG CAPS capsule Take 0.4 mg by mouth daily. 09/26/17   [provider]  tiZANidine (ZANAFLEX) 2 MG tablet Take 1 tablet (2 mg total) by mouth every 8 (eight) hours as needed for muscle spasms. 10/12/17   Gary Fleet, PA-C    Family History Family History  Problem Relation Age of Onset  . Cancer Mother   . Diabetes Mellitus II Father   . Hypertension Father   . Heart disease Father   . Glaucoma Father   . Macular  degeneration Neg Hx     Social History Social History   Tobacco Use  . Smoking status: Former Smoker    Types: Cigarettes    Last attempt to quit: 01/21/1976    Years since quitting: 41.7  . Smokeless tobacco: Never Used  Substance Use Topics  . Alcohol use: Yes    Frequency: Never    Comment: occasional  . Drug use: No     Allergies   Patient has no known allergies.   Review of Systems Review of Systems  All other systems reviewed and are negative.    Physical Exam Updated Vital Signs BP 118/60   Pulse 80   Temp (!) 97.4 F (36.3 C) (Oral)   Resp 16   Ht 6' (1.829 m)   Wt 103.4 kg (228 lb)   SpO2  97%   BMI 30.92 kg/m   Physical Exam  Constitutional: He is oriented to person, place, and time. He appears well-developed and well-nourished. No distress.  HENT:  Head: Normocephalic and atraumatic.  Eyes: EOM are normal. Pupils are equal, round, and reactive to light.  Mildly pale conjunctive a  Cardiovascular: Normal rate.  Pulmonary/Chest: Effort normal.  Musculoskeletal:       Legs: Neurological: He is alert and oriented to person, place, and time.  Skin: Skin is warm and dry.  Slightly pale  Psychiatric: He has a normal mood and affect. His behavior is normal.  Nursing note and vitals reviewed.    ED Treatments / Results  Labs (all labs ordered are listed, but only abnormal results are displayed) Labs Reviewed  CBC WITH DIFFERENTIAL/PLATELET  BASIC METABOLIC PANEL    EKG  EKG Interpretation None       Radiology No results found.  Procedures Procedures (including critical care time)  Medications Ordered in ED Medications - No data to display   Initial Impression / Assessment and Plan / ED Course  I have reviewed the triage vital signs and the nursing notes.  Pertinent labs & imaging results that were available during my care of the patient were reviewed by me and considered in my medical decision making (see chart for details).      Patient presenting because of ongoing oozing from his incision site from his hip replacement 2 weeks ago.  It appears to be dark thin blood that is coming from the site.  More pronounced with palpation.  No erythema of the incision or evidence of infection at this point.  Patient saw Dr. Berenice Primas 2 days ago had removal of a large syringe of blood per the patient and was started on antibiotic.  He is still also taking aspirin and Plavix. He is otherwise in no acute distress denies any symptoms concerning for worsening anemia.  Will discuss with orthopedics. 10:49 PM Spoke with Dr. Grandville Silos who recommended placing a clean dressing and keeping the dressing clean.  Patient is only to be on aspirin daily and should not be on Plavix per discharge summary on 10/15/2017.  Patient also to call Dr. Berenice Primas office in the morning to see if he needs to follow-up sooner than Thursday.  Final Clinical Impressions(s) / ED Diagnoses   Final diagnoses:  Bleeding from right hip wound, initial encounter    ED Discharge Orders    None       Blanchie Dessert, MD 10/28/17 2252

## 2017-10-28 NOTE — ED Triage Notes (Signed)
Pt arriving from [place] with bleeding from right hip surgery site. Pt had surgery on 10/11/17. Pt able to stand with assistance.

## 2017-10-29 ENCOUNTER — Other Ambulatory Visit: Payer: Self-pay | Admitting: *Deleted

## 2017-10-29 DIAGNOSIS — E669 Obesity, unspecified: Principal | ICD-10-CM

## 2017-10-29 DIAGNOSIS — E1169 Type 2 diabetes mellitus with other specified complication: Secondary | ICD-10-CM

## 2017-10-29 NOTE — ED Notes (Signed)
[facility] contacted for pt discharge

## 2017-10-29 NOTE — Patient Outreach (Addendum)
[facility]) Care Management  10/29/2017  Nathan Pennington 12-26-1935 [id number]   Met with [name], SW at facility. She reports patient has a lot of questions about DME and personal care services.  She states patient lives with wife and they care for his mother-in-law who is 80 years old. She reports patient was independent prior to this surgery and was running all of the errands and most of the IADLs for his wife and mother-in-law.  She states patient could benefit from [facility] services.   Met with patient and reviewed [facility] program.  He is familiar with [facility] as he thinks his mother-in-law has had someone from our care management program before.  Patient reports he is ready to go home as soon as the facility says he is ready.  Patient is looking at getting a stair lift for the home. The patient mother in law already has some personal care services coming out on M,W,F.   Reviewed [facility] program, patient would like to participate and signed consent.  Packet left in room.  Discussed that our program would start when patient discharges from facility. Hoping that will be within the next week.  Patient affirms that Dr. [name] in [facility] is his Primary care physician.  Plan to make referral to [facility] for transition of care upon discharge.  Will let [facility] RNCM know of discharge date when set.  Royetta Crochet. Laymond Purser, RN, BSN, [place] [phone]) Business Cell  [phone]) Toll Free Office

## 2017-10-30 ENCOUNTER — Other Ambulatory Visit: Payer: Self-pay | Admitting: Licensed Clinical Social Worker

## 2017-10-30 DIAGNOSIS — Z9889 Other specified postprocedural states: Secondary | ICD-10-CM | POA: Diagnosis not present

## 2017-10-30 NOTE — Patient Outreach (Signed)
[facility]) Care Management  [facility] Social Work  10/30/2017  Nathan Pennington 30-Jan-1936 [id number]  Encounter Medications:  Outpatient Encounter Medications as of 10/30/2017  Medication Sig  . albuterol (PROVENTIL HFA) 108 (90 Base) MCG/ACT inhaler Inhale 2 puffs into the lungs every 6 (six) hours as needed for shortness of breath.  . ALPHAGAN P 0.1 % SOLN Place 1 drop into both eyes daily.  Marland Kitchen aspirin EC 325 MG tablet Take 1 tablet (325 mg total) by mouth 2 (two) times daily. To decrease risk of blood clots. Take x 48monthpost op.  . budesonide-formoterol (SYMBICORT) 160-4.5 MCG/ACT inhaler Inhale 2 puffs into the lungs 2 (two) times daily.  .Marland Kitchendocusate sodium (COLACE) 100 MG capsule Take 1 capsule (100 mg total) by mouth 2 (two) times daily.  . fenofibrate (TRICOR) 48 MG tablet Take 48 mg by mouth daily.  . finasteride (PROSCAR) 5 MG tablet Take 5 mg by mouth daily.  . Flaxseed, Linseed, (FLAX SEEDS PO) Take 1 capsule by mouth daily.  .Marland KitchenglipiZIDE (GLUCOTROL XL) 2.5 MG 24 hr tablet Take 2.5 mg by mouth daily.  .Marland KitchenHYDROcodone-acetaminophen (NORCO) 5-325 MG tablet Take 1 tablet by mouth every 6 (six) hours as needed for moderate pain.  .Marland Kitchenlatanoprost (XALATAN) 0.005 % ophthalmic solution Place 1 drop into both eyes at bedtime.  .Marland Kitchenlevothyroxine (SYNTHROID, LEVOTHROID) 75 MCG tablet Take 75 mcg by mouth daily.  .Marland Kitchenlisinopril (PRINIVIL,ZESTRIL) 2.5 MG tablet Take 2.5 mg by mouth daily.  . meclizine (ANTIVERT) 25 MG tablet Take 25 mg by mouth as needed. Travel sickness  . metFORMIN (GLUCOPHAGE) 1000 MG tablet Take 1,000 mg by mouth 2 (two) times daily.  .Marland Kitchenomega-3 acid ethyl esters (LOVAZA) 1 g capsule Take 1 g by mouth 2 (two) times daily.  .Marland KitchenOVER THE COUNTER MEDICATION Take 1 capsule by mouth daily. Zypan digestive tablet  . OVER THE COUNTER MEDICATION Take 1 capsule by mouth at bedtime. Optiprostate  . OVER THE COUNTER MEDICATION Take 1 Scoop by mouth daily. Reds powder  .  rosuvastatin (CRESTOR) 20 MG tablet Take 20 mg by mouth daily.  . tamsulosin (FLOMAX) 0.4 MG CAPS capsule Take 0.4 mg by mouth daily.  .Marland KitchentiZANidine (ZANAFLEX) 2 MG tablet Take 1 tablet (2 mg total) by mouth every 8 (eight) hours as needed for muscle spasms.   No facility-administered encounter medications on file as of 10/30/2017.     Functional Status:  In your present state of health, do you have any difficulty performing the following activities: 10/30/2017 10/12/2017  Hearing? YTempie Donning Vision? N N  Difficulty concentrating or making decisions? N N  Walking or climbing stairs? Y Y  Dressing or bathing? Y Y  Doing errands, shopping? N N  Some recent data might be hidden    Fall/Depression Screening:  No flowsheet data found.  Assessment: CSW [name] [name] room but was unable to find patient. CSW later found patient in the cafeteria. CSW introduced self, reason for visit and of [name] Patient met with [facility]inator yesterday and [facility] Patient reports that he had a medical appointment today to check on bleeding from the right hip which is why he was not at facility earlier. He states that appointment went well but that he is still bleeding  some but that it has improved thankfully. He shares that he will need to get an air bandage to allow the area to breathe. Patient states that he scheduled a follow up appointment in one week to re-check the bleeding area. Patient shares that he lives at home with his wife and his 15 year old mother in law. Patient reports that he was very independent prior to surgery and was the one in the household who was providing transportation, getting groceries, running errands and preparing meals. He shares that his spouse has been in a walker for 6 weeks now. Patient reports  that he that before he arrived at SNF, he designed a portable railing and handles for his mother in law which were placed on the steps from the garage floor to the 1st floor. He reports that they will need a possible stair list for the steps from the 1st floor to the 2nd floor. Patient reports that he received an estimate for this to be done and is hopeful that this will be taken care of before he discharges back home from SNF. Patient reports that his spouse is able to provide stable transportation for him if needed post discharge. Patient feels comfortable with his plan to discharge back home. CSW met with SNF social worker but she declined any further updates at this time. No discharge has been set at this time.  Patient's P[facility] is [facility] [name] [place].   Plan-CSW will follow up within one week and will await for SNF discharge.  [name], BSW, MSW, [email or web address] Phone: [phone] Fax: [phone]    Plan:

## 2017-11-01 DIAGNOSIS — Z9889 Other specified postprocedural states: Secondary | ICD-10-CM | POA: Diagnosis not present

## 2017-11-02 ENCOUNTER — Other Ambulatory Visit: Payer: Self-pay

## 2017-11-02 ENCOUNTER — Inpatient Hospital Stay (HOSPITAL_COMMUNITY)
Admission: RE | Admit: 2017-11-02 | Discharge: 2017-11-05 | DRG: 921 | Disposition: A | Discharge status: Home Health | Payer: PPO | Source: Ambulatory Visit | Attending: Orthopedic Surgery | Admitting: Orthopedic Surgery

## 2017-11-02 ENCOUNTER — Inpatient Hospital Stay (HOSPITAL_COMMUNITY): Payer: PPO | Admitting: Certified Registered Nurse Anesthetist

## 2017-11-02 ENCOUNTER — Encounter (HOSPITAL_COMMUNITY)
Admission: RE | Disposition: A | Discharge status: Home Health | Payer: Self-pay | Source: Ambulatory Visit | Attending: Orthopedic Surgery

## 2017-11-02 ENCOUNTER — Encounter (HOSPITAL_COMMUNITY): Payer: Self-pay | Admitting: Anesthesiology

## 2017-11-02 DIAGNOSIS — L7632 Postprocedural hematoma of skin and subcutaneous tissue following other procedure: Secondary | ICD-10-CM | POA: Diagnosis not present

## 2017-11-02 DIAGNOSIS — Z95828 Presence of other vascular implants and grafts: Secondary | ICD-10-CM | POA: Diagnosis not present

## 2017-11-02 DIAGNOSIS — Z8673 Personal history of transient ischemic attack (TIA), and cerebral infarction without residual deficits: Secondary | ICD-10-CM

## 2017-11-02 DIAGNOSIS — E1151 Type 2 diabetes mellitus with diabetic peripheral angiopathy without gangrene: Secondary | ICD-10-CM | POA: Diagnosis present

## 2017-11-02 DIAGNOSIS — J449 Chronic obstructive pulmonary disease, unspecified: Secondary | ICD-10-CM | POA: Diagnosis present

## 2017-11-02 DIAGNOSIS — Z1619 Resistance to other specified beta lactam antibiotics: Secondary | ICD-10-CM | POA: Diagnosis not present

## 2017-11-02 DIAGNOSIS — M25561 Pain in right knee: Secondary | ICD-10-CM | POA: Diagnosis not present

## 2017-11-02 DIAGNOSIS — E039 Hypothyroidism, unspecified: Secondary | ICD-10-CM | POA: Diagnosis present

## 2017-11-02 DIAGNOSIS — Z87891 Personal history of nicotine dependence: Secondary | ICD-10-CM | POA: Diagnosis not present

## 2017-11-02 DIAGNOSIS — Z7984 Long term (current) use of oral hypoglycemic drugs: Secondary | ICD-10-CM

## 2017-11-02 DIAGNOSIS — H409 Unspecified glaucoma: Secondary | ICD-10-CM | POA: Diagnosis not present

## 2017-11-02 DIAGNOSIS — Z96641 Presence of right artificial hip joint: Secondary | ICD-10-CM

## 2017-11-02 DIAGNOSIS — T8451XA Infection and inflammatory reaction due to internal right hip prosthesis, initial encounter: Secondary | ICD-10-CM | POA: Diagnosis not present

## 2017-11-02 DIAGNOSIS — E785 Hyperlipidemia, unspecified: Secondary | ICD-10-CM | POA: Diagnosis not present

## 2017-11-02 DIAGNOSIS — X58XXXA Exposure to other specified factors, initial encounter: Secondary | ICD-10-CM | POA: Diagnosis not present

## 2017-11-02 DIAGNOSIS — B9620 Unspecified Escherichia coli [E. coli] as the cause of diseases classified elsewhere: Secondary | ICD-10-CM | POA: Diagnosis not present

## 2017-11-02 DIAGNOSIS — T8149XA Infection following a procedure, other surgical site, initial encounter: Secondary | ICD-10-CM | POA: Diagnosis present

## 2017-11-02 DIAGNOSIS — I10 Essential (primary) hypertension: Secondary | ICD-10-CM | POA: Diagnosis present

## 2017-11-02 DIAGNOSIS — Z79899 Other long term (current) drug therapy: Secondary | ICD-10-CM | POA: Diagnosis not present

## 2017-11-02 DIAGNOSIS — Z885 Allergy status to narcotic agent status: Secondary | ICD-10-CM

## 2017-11-02 DIAGNOSIS — S72001A Fracture of unspecified part of neck of right femur, initial encounter for closed fracture: Secondary | ICD-10-CM | POA: Diagnosis present

## 2017-11-02 DIAGNOSIS — Y838 Other surgical procedures as the cause of abnormal reaction of the patient, or of later complication, without mention of misadventure at the time of the procedure: Secondary | ICD-10-CM | POA: Diagnosis present

## 2017-11-02 HISTORY — PX: I & D EXTREMITY: SHX5045

## 2017-11-02 LAB — BASIC METABOLIC PANEL
ANION GAP: 12 (ref 5–15)
BUN: 13 mg/dL (ref 6–20)
CO2: 24 mmol/L (ref 22–32)
Calcium: 9.1 mg/dL (ref 8.9–10.3)
Chloride: 106 mmol/L (ref 101–111)
Creatinine, Ser: 0.68 mg/dL (ref 0.61–1.24)
GLUCOSE: 103 mg/dL — AB (ref 65–99)
Potassium: 4.4 mmol/L (ref 3.5–5.1)
Sodium: 142 mmol/L (ref 135–145)

## 2017-11-02 LAB — GLUCOSE, CAPILLARY
Glucose-Capillary: 95 mg/dL (ref 65–99)
Glucose-Capillary: 118 mg/dL — ABNORMAL HIGH (ref 65–99)
Glucose-Capillary: 137 mg/dL — ABNORMAL HIGH (ref 65–99)

## 2017-11-02 LAB — CBC WITH DIFFERENTIAL/PLATELET
BASOS PCT: 1 %
Basophils Absolute: 0.1 10*3/uL (ref 0.0–0.1)
EOS ABS: 0.3 10*3/uL (ref 0.0–0.7)
Eosinophils Relative: 4 %
HCT: 34.2 % — ABNORMAL LOW (ref 39.0–52.0)
HEMOGLOBIN: 10.7 g/dL — AB (ref 13.0–17.0)
Lymphocytes Relative: 16 %
Lymphs Abs: 1.3 10*3/uL (ref 0.7–4.0)
MCH: 30.9 pg (ref 26.0–34.0)
MCHC: 31.3 g/dL (ref 30.0–36.0)
MCV: 98.8 fL (ref 78.0–100.0)
Monocytes Absolute: 0.9 10*3/uL (ref 0.1–1.0)
Monocytes Relative: 11 %
Neutro Abs: 5.3 10*3/uL (ref 1.7–7.7)
Neutrophils Relative %: 68 %
Platelets: 522 10*3/uL — ABNORMAL HIGH (ref 150–400)
RBC: 3.46 MIL/uL — AB (ref 4.22–5.81)
RDW: 14.4 % (ref 11.5–15.5)
WBC: 7.8 10*3/uL (ref 4.0–10.5)

## 2017-11-02 LAB — C-REACTIVE PROTEIN: CRP: <0.8 mg/dL (ref <1.0)

## 2017-11-02 LAB — SEDIMENTATION RATE: SED RATE: 61 mm/h — AB (ref 0–16)

## 2017-11-02 SURGERY — IRRIGATION AND DEBRIDEMENT EXTREMITY
Anesthesia: General | Site: Hip | Laterality: Right

## 2017-11-02 MED ORDER — LIDOCAINE 2% (20 MG/ML) 5 ML SYRINGE
INTRAMUSCULAR | Status: AC
  Filled 2017-11-02: qty 5

## 2017-11-02 MED ORDER — MAGNESIUM CITRATE PO SOLN: 1.0000 | Freq: Once | ORAL | Status: DC | PRN

## 2017-11-02 MED ORDER — SUGAMMADEX SODIUM 200 MG/2ML IV SOLN
INTRAVENOUS | Status: DC | PRN
  Administered 2017-11-02: 150 mg via INTRAVENOUS

## 2017-11-02 MED ORDER — FENTANYL CITRATE (PF) 100 MCG/2ML IJ SOLN
INTRAMUSCULAR | Status: AC
  Filled 2017-11-02: qty 2

## 2017-11-02 MED ORDER — PROPOFOL 10 MG/ML IV BOLUS
INTRAVENOUS | Status: DC | PRN
  Administered 2017-11-02: 120 mg via INTRAVENOUS

## 2017-11-02 MED ORDER — ALBUTEROL SULFATE HFA 108 (90 BASE) MCG/ACT IN AERS: 2.0000 | INHALATION_SPRAY | Freq: Four times a day (QID) | RESPIRATORY_TRACT | Status: DC | PRN

## 2017-11-02 MED ORDER — LACTATED RINGERS IV SOLN
INTRAVENOUS | Status: DC
  Administered 2017-11-02: 15:00:00 via INTRAVENOUS

## 2017-11-02 MED ORDER — INSULIN ASPART 100 UNIT/ML ~~LOC~~ SOLN
0.0000 [IU] | Freq: Three times a day (TID) | SUBCUTANEOUS | Status: DC
  Administered 2017-11-03: 3 [IU] via SUBCUTANEOUS
  Administered 2017-11-04: 2 [IU] via SUBCUTANEOUS
  Administered 2017-11-04: 3 [IU] via SUBCUTANEOUS
  Administered 2017-11-05: 1 [IU] via SUBCUTANEOUS
  Administered 2017-11-05: 3 [IU] via SUBCUTANEOUS

## 2017-11-02 MED ORDER — HYDROMORPHONE HCL 1 MG/ML IJ SOLN
0.5000 mg | INTRAMUSCULAR | Status: DC | PRN
  Administered 2017-11-02: 1 mg via INTRAVENOUS
  Filled 2017-11-02: qty 1

## 2017-11-02 MED ORDER — DEXAMETHASONE SODIUM PHOSPHATE 10 MG/ML IJ SOLN
INTRAMUSCULAR | Status: AC
Start: 2017-11-02 — End: ?
  Filled 2017-11-02: qty 1

## 2017-11-02 MED ORDER — ONDANSETRON HCL 4 MG/2ML IJ SOLN
INTRAMUSCULAR | Status: DC | PRN
  Administered 2017-11-02: 4 mg via INTRAVENOUS

## 2017-11-02 MED ORDER — SODIUM CHLORIDE 0.9 % IR SOLN
Status: DC | PRN
  Administered 2017-11-02: 7000 mL

## 2017-11-02 MED ORDER — LEVOTHYROXINE SODIUM 75 MCG PO TABS
75.0000 ug | ORAL_TABLET | Freq: Every day | ORAL | Status: DC
  Administered 2017-11-03 – 2017-11-05 (×3): 75 ug via ORAL
  Filled 2017-11-02 (×3): qty 1

## 2017-11-02 MED ORDER — HYDROCODONE-ACETAMINOPHEN 5-325 MG PO TABS
1.0000 | ORAL_TABLET | ORAL | Status: DC | PRN
  Filled 2017-11-02: qty 2

## 2017-11-02 MED ORDER — MOMETASONE FURO-FORMOTEROL FUM 200-5 MCG/ACT IN AERO
2.0000 | INHALATION_SPRAY | Freq: Two times a day (BID) | RESPIRATORY_TRACT | Status: DC
  Administered 2017-11-02 – 2017-11-05 (×6): 2 via RESPIRATORY_TRACT
  Filled 2017-11-02: qty 8.8

## 2017-11-02 MED ORDER — LIDOCAINE 2% (20 MG/ML) 5 ML SYRINGE
INTRAMUSCULAR | Status: DC | PRN
  Administered 2017-11-02: 60 mg via INTRAVENOUS

## 2017-11-02 MED ORDER — HYDROMORPHONE HCL 2 MG/ML IJ SOLN
INTRAMUSCULAR | Status: AC
  Filled 2017-11-02: qty 1

## 2017-11-02 MED ORDER — ONDANSETRON HCL 4 MG/2ML IJ SOLN: 4.0000 mg | Freq: Four times a day (QID) | INTRAMUSCULAR | Status: DC | PRN

## 2017-11-02 MED ORDER — SUCCINYLCHOLINE CHLORIDE 200 MG/10ML IV SOSY
PREFILLED_SYRINGE | INTRAVENOUS | Status: AC
  Filled 2017-11-02: qty 10

## 2017-11-02 MED ORDER — BRIMONIDINE TARTRATE 0.15 % OP SOLN
1.0000 [drp] | Freq: Every day | OPHTHALMIC | Status: DC
Start: 2017-11-03 — End: 2017-11-05
  Administered 2017-11-03 – 2017-11-05 (×3): 1 [drp] via OPHTHALMIC
  Filled 2017-11-02: qty 5

## 2017-11-02 MED ORDER — ASPIRIN EC 325 MG PO TBEC
325.0000 mg | DELAYED_RELEASE_TABLET | Freq: Two times a day (BID) | ORAL | Status: DC
  Administered 2017-11-02 – 2017-11-05 (×6): 325 mg via ORAL
  Filled 2017-11-02 (×6): qty 1

## 2017-11-02 MED ORDER — GLUCERNA SHAKE PO LIQD
237.0000 mL | Freq: Every day | ORAL | Status: DC
  Administered 2017-11-03 – 2017-11-05 (×3): 237 mL via ORAL
  Filled 2017-11-02 (×3): qty 237

## 2017-11-02 MED ORDER — STERILE WATER FOR IRRIGATION IR SOLN
Status: DC | PRN
  Administered 2017-11-02: 2000 mL

## 2017-11-02 MED ORDER — FENTANYL CITRATE (PF) 100 MCG/2ML IJ SOLN
INTRAMUSCULAR | Status: DC | PRN
  Administered 2017-11-02 (×4): 50 ug via INTRAVENOUS

## 2017-11-02 MED ORDER — CEFAZOLIN SODIUM-DEXTROSE 2-4 GM/100ML-% IV SOLN
INTRAVENOUS | Status: AC
  Filled 2017-11-02: qty 100

## 2017-11-02 MED ORDER — ROCURONIUM BROMIDE 10 MG/ML (PF) SYRINGE
PREFILLED_SYRINGE | INTRAVENOUS | Status: DC | PRN
  Administered 2017-11-02: 30 mg via INTRAVENOUS

## 2017-11-02 MED ORDER — FENOFIBRATE 54 MG PO TABS
54.0000 mg | ORAL_TABLET | Freq: Every day | ORAL | Status: DC
  Administered 2017-11-03 – 2017-11-05 (×3): 54 mg via ORAL
  Filled 2017-11-02 (×4): qty 1

## 2017-11-02 MED ORDER — ALBUTEROL SULFATE (2.5 MG/3ML) 0.083% IN NEBU: 2.5000 mg | INHALATION_SOLUTION | Freq: Four times a day (QID) | RESPIRATORY_TRACT | Status: DC | PRN

## 2017-11-02 MED ORDER — HYDROMORPHONE HCL 1 MG/ML IJ SOLN
INTRAMUSCULAR | Status: DC | PRN
  Administered 2017-11-02 (×2): 0.5 mg via INTRAVENOUS

## 2017-11-02 MED ORDER — ROCURONIUM BROMIDE 10 MG/ML (PF) SYRINGE
PREFILLED_SYRINGE | INTRAVENOUS | Status: AC
  Filled 2017-11-02: qty 5

## 2017-11-02 MED ORDER — VANCOMYCIN HCL IN DEXTROSE 1-5 GM/200ML-% IV SOLN
INTRAVENOUS | Status: AC
  Filled 2017-11-02: qty 200

## 2017-11-02 MED ORDER — POLYETHYLENE GLYCOL 3350 17 G PO PACK: 17.0000 g | PACK | Freq: Every day | ORAL | Status: DC | PRN

## 2017-11-02 MED ORDER — DEXAMETHASONE SODIUM PHOSPHATE 4 MG/ML IJ SOLN
INTRAMUSCULAR | Status: DC | PRN
  Administered 2017-11-02: 5 mg via INTRAVENOUS

## 2017-11-02 MED ORDER — TRANEXAMIC ACID 1000 MG/10ML IV SOLN
1000.0000 mg | INTRAVENOUS | Status: AC
  Administered 2017-11-02: 1000 mg via INTRAVENOUS
  Filled 2017-11-02: qty 1100

## 2017-11-02 MED ORDER — SUGAMMADEX SODIUM 200 MG/2ML IV SOLN
INTRAVENOUS | Status: AC
  Filled 2017-11-02: qty 2

## 2017-11-02 MED ORDER — HYDROGEN PEROXIDE 3 % EX SOLN
CUTANEOUS | Status: DC | PRN
  Administered 2017-11-02: 1

## 2017-11-02 MED ORDER — GLIPIZIDE ER 2.5 MG PO TB24
2.5000 mg | ORAL_TABLET | Freq: Every day | ORAL | Status: DC
  Administered 2017-11-03 – 2017-11-05 (×3): 2.5 mg via ORAL
  Filled 2017-11-02 (×4): qty 1

## 2017-11-02 MED ORDER — METHOCARBAMOL 1000 MG/10ML IJ SOLN
500.0000 mg | Freq: Four times a day (QID) | INTRAVENOUS | Status: DC | PRN
  Filled 2017-11-02: qty 5

## 2017-11-02 MED ORDER — VANCOMYCIN HCL IN DEXTROSE 1-5 GM/200ML-% IV SOLN
1000.0000 mg | Freq: Two times a day (BID) | INTRAVENOUS | Status: DC
  Administered 2017-11-02 – 2017-11-04 (×5): 1000 mg via INTRAVENOUS
  Filled 2017-11-02 (×4): qty 200

## 2017-11-02 MED ORDER — ACETAMINOPHEN 650 MG RE SUPP: 650.0000 mg | RECTAL | Status: DC | PRN

## 2017-11-02 MED ORDER — LISINOPRIL 2.5 MG PO TABS
2.5000 mg | ORAL_TABLET | Freq: Every day | ORAL | Status: DC
  Administered 2017-11-03 – 2017-11-05 (×3): 2.5 mg via ORAL
  Filled 2017-11-02 (×4): qty 1

## 2017-11-02 MED ORDER — ONDANSETRON HCL 4 MG/2ML IJ SOLN
INTRAMUSCULAR | Status: AC
  Filled 2017-11-02: qty 2

## 2017-11-02 MED ORDER — BISACODYL 5 MG PO TBEC: 5.0000 mg | DELAYED_RELEASE_TABLET | Freq: Every day | ORAL | Status: DC | PRN

## 2017-11-02 MED ORDER — PHENYLEPHRINE 40 MCG/ML (10ML) SYRINGE FOR IV PUSH (FOR BLOOD PRESSURE SUPPORT)
PREFILLED_SYRINGE | INTRAVENOUS | Status: AC
  Filled 2017-11-02: qty 10

## 2017-11-02 MED ORDER — METHOCARBAMOL 500 MG PO TABS
500.0000 mg | ORAL_TABLET | Freq: Four times a day (QID) | ORAL | Status: DC | PRN
Start: 2017-11-02 — End: 2017-11-05
  Administered 2017-11-04: 500 mg via ORAL
  Filled 2017-11-02: qty 1

## 2017-11-02 MED ORDER — TAMSULOSIN HCL 0.4 MG PO CAPS
0.4000 mg | ORAL_CAPSULE | Freq: Every day | ORAL | Status: DC
  Administered 2017-11-02 – 2017-11-04 (×3): 0.4 mg via ORAL
  Filled 2017-11-02 (×3): qty 1

## 2017-11-02 MED ORDER — GLUCERNA PO LIQD: 237.0000 mL | Freq: Every day | ORAL | Status: DC

## 2017-11-02 MED ORDER — PHENYLEPHRINE 40 MCG/ML (10ML) SYRINGE FOR IV PUSH (FOR BLOOD PRESSURE SUPPORT)
PREFILLED_SYRINGE | INTRAVENOUS | Status: DC | PRN
  Administered 2017-11-02: 80 ug via INTRAVENOUS

## 2017-11-02 MED ORDER — SODIUM CHLORIDE 0.45 % IV SOLN
INTRAVENOUS | Status: DC
  Administered 2017-11-02 – 2017-11-03 (×2): via INTRAVENOUS

## 2017-11-02 MED ORDER — POVIDONE-IODINE 10 % EX SOLN
CUTANEOUS | Status: DC | PRN
  Administered 2017-11-02: 1 via TOPICAL

## 2017-11-02 MED ORDER — ACETAMINOPHEN 325 MG PO TABS
650.0000 mg | ORAL_TABLET | ORAL | Status: DC | PRN
  Administered 2017-11-04: 650 mg via ORAL
  Filled 2017-11-02: qty 2

## 2017-11-02 MED ORDER — ROSUVASTATIN CALCIUM 20 MG PO TABS
20.0000 mg | ORAL_TABLET | Freq: Every day | ORAL | Status: DC
  Administered 2017-11-02 – 2017-11-04 (×3): 20 mg via ORAL
  Filled 2017-11-02: qty 1
  Filled 2017-11-02: qty 2
  Filled 2017-11-02: qty 1
  Filled 2017-11-02: qty 2
  Filled 2017-11-02: qty 1
  Filled 2017-11-02: qty 2

## 2017-11-02 MED ORDER — ONDANSETRON HCL 4 MG PO TABS: 4.0000 mg | ORAL_TABLET | Freq: Four times a day (QID) | ORAL | Status: DC | PRN

## 2017-11-02 MED ORDER — SUCCINYLCHOLINE CHLORIDE 200 MG/10ML IV SOSY
PREFILLED_SYRINGE | INTRAVENOUS | Status: DC | PRN
  Administered 2017-11-02: 120 mg via INTRAVENOUS

## 2017-11-02 MED ORDER — FENTANYL CITRATE (PF) 100 MCG/2ML IJ SOLN: 25.0000 ug | INTRAMUSCULAR | Status: DC | PRN

## 2017-11-02 MED ORDER — DOCUSATE SODIUM 100 MG PO CAPS
100.0000 mg | ORAL_CAPSULE | Freq: Two times a day (BID) | ORAL | Status: DC
  Administered 2017-11-02 – 2017-11-05 (×6): 100 mg via ORAL
  Filled 2017-11-02 (×6): qty 1

## 2017-11-02 MED ORDER — VANCOMYCIN HCL 1000 MG IV SOLR
INTRAVENOUS | Status: DC | PRN
  Administered 2017-11-02: 1000 mg via INTRAVENOUS

## 2017-11-02 MED ORDER — FINASTERIDE 5 MG PO TABS
5.0000 mg | ORAL_TABLET | Freq: Every day | ORAL | Status: DC
  Administered 2017-11-03 – 2017-11-05 (×3): 5 mg via ORAL
  Filled 2017-11-02 (×3): qty 1

## 2017-11-02 MED ORDER — METFORMIN HCL 500 MG PO TABS
1000.0000 mg | ORAL_TABLET | Freq: Two times a day (BID) | ORAL | Status: DC
  Administered 2017-11-03 – 2017-11-05 (×5): 1000 mg via ORAL
  Filled 2017-11-02 (×5): qty 2

## 2017-11-02 MED ORDER — 0.9 % SODIUM CHLORIDE (POUR BTL) OPTIME
TOPICAL | Status: DC | PRN
  Administered 2017-11-02: 1000 mL

## 2017-11-02 MED ORDER — LATANOPROST 0.005 % OP SOLN
1.0000 [drp] | Freq: Every day | OPHTHALMIC | Status: DC
  Administered 2017-11-02 – 2017-11-04 (×3): 1 [drp] via OPHTHALMIC
  Filled 2017-11-02: qty 2.5

## 2017-11-02 MED ORDER — SODIUM CHLORIDE 0.9 % IV SOLN
2.0000 g | Freq: Three times a day (TID) | INTRAVENOUS | Status: DC
  Administered 2017-11-02 – 2017-11-05 (×8): 2 g via INTRAVENOUS
  Filled 2017-11-02 (×13): qty 2

## 2017-11-02 MED ORDER — HYDROGEN PEROXIDE 3 % EX SOLN
CUTANEOUS | Status: AC
  Filled 2017-11-02: qty 473

## 2017-11-02 SURGICAL SUPPLY — 51 items
BAG ZIPLOCK 12X15 (MISCELLANEOUS) ×3 IMPLANT
CLOSURE WOUND 1/2 X4 (GAUZE/BANDAGES/DRESSINGS) ×4
CONT SPEC DRY SEAL-RITE 20ML (COLLECTOR) ×3 IMPLANT
COVER SURGICAL LIGHT HANDLE (MISCELLANEOUS) ×3 IMPLANT
DRAPE INCISE IOBAN 85X60 (DRAPES) ×3 IMPLANT
DRAPE ORTHO SPLIT 77X108 STRL (DRAPES) ×4
DRAPE POUCH INSTRU U-SHP 10X18 (DRAPES) ×3 IMPLANT
DRAPE SURG 17X11 SM STRL (DRAPES) ×3 IMPLANT
DRAPE SURG ORHT 6 SPLT 77X108 (DRAPES) ×2 IMPLANT
DRAPE U-SHAPE 47X51 STRL (DRAPES) ×3 IMPLANT
DRSG ADAPTIC 3X8 NADH LF (GAUZE/BANDAGES/DRESSINGS) ×3 IMPLANT
DRSG VAC ATS SM SENSATRAC (GAUZE/BANDAGES/DRESSINGS) ×3 IMPLANT
DURAPREP 26ML APPLICATOR (WOUND CARE) ×3 IMPLANT
ELECT BLADE TIP CTD 4 INCH (ELECTRODE) ×3 IMPLANT
ELECT REM PT RETURN 15FT ADLT (MISCELLANEOUS) ×3 IMPLANT
EVACUATOR 1/8 PVC DRAIN (DRAIN) ×3 IMPLANT
FACESHIELD WRAPAROUND (MASK) ×6 IMPLANT
GAUZE SPONGE 2X2 8PLY STRL LF (GAUZE/BANDAGES/DRESSINGS) ×1 IMPLANT
GAUZE SPONGE 4X4 12PLY STRL (GAUZE/BANDAGES/DRESSINGS) ×3 IMPLANT
GAUZE XEROFORM 1X8 LF (GAUZE/BANDAGES/DRESSINGS) ×3 IMPLANT
GLOVE BIOGEL PI IND STRL 7.5 (GLOVE) ×4 IMPLANT
GLOVE BIOGEL PI IND STRL 8 (GLOVE) ×2 IMPLANT
GLOVE BIOGEL PI INDICATOR 7.5 (GLOVE) ×8
GLOVE BIOGEL PI INDICATOR 8 (GLOVE) ×4
GLOVE ECLIPSE 7.5 STRL STRAW (GLOVE) ×9 IMPLANT
GOWN SPEC L3 XXLG W/TWL (GOWN DISPOSABLE) ×3 IMPLANT
GOWN STRL REUS W/TWL LRG LVL3 (GOWN DISPOSABLE) ×3 IMPLANT
GOWN STRL REUS W/TWL XL LVL3 (GOWN DISPOSABLE) ×6 IMPLANT
KIT BASIN OR (CUSTOM PROCEDURE TRAY) ×3 IMPLANT
MANIFOLD NEPTUNE II (INSTRUMENTS) ×3 IMPLANT
NEEDLE SPNL 18GX3.5 QUINCKE PK (NEEDLE) ×3 IMPLANT
PACK TOTAL JOINT (CUSTOM PROCEDURE TRAY) ×3 IMPLANT
POSITIONER SURGICAL ARM (MISCELLANEOUS) ×3 IMPLANT
SPONGE GAUZE 2X2 STER 10/PKG (GAUZE/BANDAGES/DRESSINGS) ×2
STAPLER VISISTAT 35W (STAPLE) ×3 IMPLANT
STRIP CLOSURE SKIN 1/2X4 (GAUZE/BANDAGES/DRESSINGS) ×8 IMPLANT
SUCTION FRAZIER HANDLE 10FR (MISCELLANEOUS) ×2
SUCTION TUBE FRAZIER 10FR DISP (MISCELLANEOUS) ×1 IMPLANT
SUCTION YANKAUER HANDLE (MISCELLANEOUS) ×3 IMPLANT
SURGIFLO W/THROMBIN 8M KIT (HEMOSTASIS) ×3 IMPLANT
SUT ETHILON 2 0 PS N (SUTURE) ×3 IMPLANT
SUT VIC AB 0 CT1 27 (SUTURE) ×2
SUT VIC AB 0 CT1 27XCR 8 STRN (SUTURE) ×1 IMPLANT
SUT VIC AB 1 CT1 36 (SUTURE) ×12 IMPLANT
SUT VIC AB 2-0 CT1 27 (SUTURE) ×4
SUT VIC AB 2-0 CT1 TAPERPNT 27 (SUTURE) ×2 IMPLANT
SWAB COLLECTION DEVICE MRSA (MISCELLANEOUS) ×3 IMPLANT
SWAB CULTURE ESWAB REG 1ML (MISCELLANEOUS) ×3 IMPLANT
TIP HIGH FLOW IRRIGATION COAX (MISCELLANEOUS) ×3 IMPLANT
TOWEL OR 17X26 10 PK STRL BLUE (TOWEL DISPOSABLE) ×6 IMPLANT
TRAY FOLEY W/METER SILVER 16FR (SET/KITS/TRAYS/PACK) ×3 IMPLANT

## 2017-11-02 NOTE — Anesthesia Procedure Notes (Signed)
Procedure Name: Intubation Date/Time: 11/02/2017 3:49 PM Performed by: Claudia Desanctis, CRNA Pre-anesthesia Checklist: Patient identified, Emergency Drugs available, Suction available and Patient being monitored Patient Re-evaluated:Patient Re-evaluated prior to induction Oxygen Delivery Method: Circle system utilized Preoxygenation: Pre-oxygenation with 100% oxygen Induction Type: IV induction Ventilation: Mask ventilation without difficulty Laryngoscope Size: 2 and Miller Grade View: Grade I Tube type: Oral Tube size: 7.5 mm Number of attempts: 1 Airway Equipment and Method: Stylet Placement Confirmation: ETT inserted through vocal cords under direct vision,  positive ETCO2 and breath sounds checked- equal and bilateral Secured at: 22 cm Tube secured with: Tape Dental Injury: Teeth and Oropharynx as per pre-operative assessment

## 2017-11-02 NOTE — Progress Notes (Signed)
Patient resides at [facility].  Having surgery on 11/02/17 late pm .  Coming from [facility].  Will have followup surgery on 11/05/17 here at [place] , [place] in [place] Stay at [facility] called [place] and spoke with Leda Gauze and was informed by the PA that patient would be staying the weekend at the hospital.

## 2017-11-02 NOTE — Transfer of Care (Signed)
Immediate Anesthesia Transfer of Care Note  Patient: Nathan Pennington  Procedure(s) Performed: IRRIGATION AND DEBRIDEMENT RIGHT HIP WITH PLACEMENT OF WOUND VAC (Right Hip)  Patient Location: PACU  Anesthesia Type:General  Level of Consciousness: awake, alert , oriented and patient cooperative  Airway & Oxygen Therapy: Patient Spontanous Breathing and Patient connected to face mask  Post-op Assessment: Report given to RN and Post -op Vital signs reviewed and stable  Post vital signs: Reviewed and stable  Last Vitals:  Vitals:   11/02/17 1501  BP: (!) 125/58  Pulse: 92  Resp: 18  Temp: 36.8 C  SpO2: 100%    Last Pain:  Vitals:   11/02/17 1501  TempSrc: Oral         Complications: No apparent anesthesia complications

## 2017-11-02 NOTE — Progress Notes (Signed)
Lab called with culture results.  From Fluid speciman: Rare WBCs and no organisms present. From swab: few WBCs and rare Gram + Cocci.    Loni Dolly, PA on call for [facility] notified. Nida was familiar with his case. Inquired if ID was already involved.  ID has already been consulted. Pt also received a dose of Vanc today. Relayed this information to [place].  Loni Dolly, PA clear on results. No further action at this point. Nida thankful for notification.  Will continue to monitor pt.  Roselind Rily

## 2017-11-02 NOTE — Brief Op Note (Signed)
11/02/2017  5:21 PM  PATIENT:  Nathan Pennington  82 y.o. male  PRE-OPERATIVE DIAGNOSIS:  infected right hip  POST-OPERATIVE DIAGNOSIS:  infected right hip  PROCEDURE:  Procedure(s): IRRIGATION AND DEBRIDEMENT RIGHT HIP WITH PLACEMENT OF WOUND VAC (Right)  SURGEON:  Surgeon(s) and Role:    Dorna Leitz, MD - Primary    * Mcarthur Rossetti, MD - Assisting  PHYSICIAN ASSISTANT:   ASSISTANTS: bethune   ANESTHESIA:   general  EBL:  20 mL   BLOOD ADMINISTERED:none  DRAINS: (2 med) Hemovact drain(s) in the r hip with  Suction Open   LOCAL MEDICATIONS USED:  NONE  SPECIMEN:  No Specimen  DISPOSITION OF SPECIMEN:  N/A  COUNTS:  YES  TOURNIQUET:  * No tourniquets in log *  DICTATION: .Other Dictation: Dictation Number [id number]  PLAN OF CARE: Admit to inpatient   PATIENT DISPOSITION:  PACU - hemodynamically stable.   Delay start of Pharmacological VTE agent (>24hrs) due to surgical blood loss or risk of bleeding: no

## 2017-11-02 NOTE — H&P (Signed)
Nathan Pennington is an 82 y.o. male.  HPI: The patient is an 82 year old male who was admitted approximately 3-1/2 weeks ago with femoral neck fracture.  He was a high functioning person and I felt the total hip replacement be appropriate.  He underwent total hip replacement and did well initially.  He went to skilled nursing and has been there for the last 3 and half weeks.  He developed drainage from his wound at about the 2-week mark and this drainage has persisted.  We were hopeful that this would slow down and stop the did appear to be a hematoma but unfortunately drainage had persisted.  Took cultures in the office yesterday and the Gram stain back with both gram-positive cocci and gram-negative rods. I called him this morning and felt that he needed to get to the hospital for irrigation and debridement and possible wound VAC placement with the nature of this drainage and an infection. Past Medical History:  Diagnosis Date  . Amblyopia   . Asthma   . Cataract   . COPD (chronic obstructive pulmonary disease) ([place])   . Diabetes mellitus without complication ([place])    TYPE 2  . Disease of thyroid gland   . Glaucoma   . History of TIA (transient ischemic attack)   . Hyperlipidemia   . Hypertension   . Hypothyroidism   . Hypothyroidism   . Peripheral neuropathy   . Prostate disease   . Retinal arterial branch occlusion, right   . TIA (transient ischemic attack)     Past Surgical History:  Procedure Laterality Date  . COLONOSCOPY  2004  . COLONOSCOPY W/ BIOPSIES Left 01/22/2016   Procedure: COLONOSCOPY, FLEXIBLE, PROXIMAL TO SPLENIC FLEXURE; WITH BIOPSY, SINGLE OR MULTIPLE; Surgeon: Darlyne Russian, MD; Location: [facility] [place]; Service: Gastroenterology  . HERNIA REPAIR    . PAROTIDECTOMY    . TONSILLECTOMY    . TOTAL HIP ARTHROPLASTY Right 10/12/2017   Procedure: TOTAL HIP ARTHROPLASTY ANTERIOR APPROACH;  Surgeon: Dorna Leitz, MD;  Location: [facility];  Service: Orthopedics;   Laterality: Right;  . UMBILICAL HERNIA REPAIR     AGE 68  . UPPER GASTROINTESTINAL ENDOSCOPY Left 01/22/2016   Procedure: UGI ENDO, INCLUDE ESOPHAGUS, STOMACH, & DUODENUM &/OR JEJUNUM; DX W/WO COLLECTION SPECIMN, BY BIOPSY; Surgeon: Darlyne Russian, MD; Location: [facility] [place]; Service: Gastroenterology    Family History  Problem Relation Age of Onset  . Cancer Mother   . Diabetes Mellitus II Father   . Hypertension Father   . Heart disease Father   . Glaucoma Father   . Macular degeneration Neg Hx     Social History:  reports that he quit smoking about 41 years ago. His smoking use included cigarettes. he has never used smokeless tobacco. He reports that he drinks alcohol. He reports that he does not use drugs.  Allergies: No Known Allergies  Medications: I have reviewed the patient's current medications.  No results found for this or any previous visit (from the past 48 hour(s)).  No results found.  ROS  ROS: I have reviewed the patient's review of systems thoroughly and there are no positive responses as relates to the HPI. Blood pressure (!) 125/58, pulse 92, temperature 98.2 F (36.8 C), temperature source Oral, resp. rate 18, height 6' (1.829 m), weight 103.9 kg (229 lb), SpO2 100 %. Physical Exam Well-developed well-nourished patient in no acute distress. Alert and oriented x3 HEENT:within normal limits Cardiac: Regular rate and rhythm Pulmonary: Lungs clear  to auscultation Abdomen: Soft and nontender.  Normal active bowel sounds  Musculoskeletal: (Right hip: Minimal pain with range of motion.  2 small punctate areas distally that persistently draining a brownish colored fluid.  Wound has some mild induration but no real evidence of inflammation or infection.)  X-ray: X-rays taken postoperatively and in the office have shown anatomic alignment of his total hip replacement without comp gating features.  Recent Results (from the past 2160 hour(s))  CBC with  Differential     Status: Abnormal   Collection Time: 10/11/17 11:27 PM  Result Value Ref Range   WBC 16.2 (H) 4.0 - 10.5 K/uL   RBC 4.57 4.22 - 5.81 MIL/uL   Hemoglobin 15.1 13.0 - 17.0 g/dL   HCT 43.6 39.0 - 52.0 %   MCV 95.4 78.0 - 100.0 fL   MCH 33.0 26.0 - 34.0 pg   MCHC 34.6 30.0 - 36.0 g/dL   RDW 13.2 11.5 - 15.5 %   Platelets 242 150 - 400 K/uL   Neutrophils Relative % 86 %   Neutro Abs 14.0 (H) 1.7 - 7.7 K/uL   Lymphocytes Relative 5 %   Lymphs Abs 0.7 0.7 - 4.0 K/uL   Monocytes Relative 9 %   Monocytes Absolute 1.4 (H) 0.1 - 1.0 K/uL   Eosinophils Relative 0 %   Eosinophils Absolute 0.1 0.0 - 0.7 K/uL   Basophils Relative 0 %   Basophils Absolute 0.0 0.0 - 0.1 K/uL  Comprehensive metabolic panel     Status: Abnormal   Collection Time: 10/11/17 11:27 PM  Result Value Ref Range   Sodium 141 135 - 145 mmol/L   Potassium 3.7 3.5 - 5.1 mmol/L   Chloride 108 101 - 111 mmol/L   CO2 26 22 - 32 mmol/L   Glucose, Bld 169 (H) 65 - 99 mg/dL   BUN 22 (H) 6 - 20 mg/dL   Creatinine, Ser 1.03 0.61 - 1.24 mg/dL   Calcium 8.9 8.9 - 10.3 mg/dL   Total Protein 6.8 6.5 - 8.1 g/dL   Albumin 3.9 3.5 - 5.0 g/dL   AST 20 15 - 41 U/L   ALT 13 (L) 17 - 63 U/L   Alkaline Phosphatase 53 38 - 126 U/L   Total Bilirubin 0.7 0.3 - 1.2 mg/dL   GFR calc non Af Amer >60 >60 mL/min   GFR calc Af Amer >60 >60 mL/min    Comment: (NOTE) The eGFR has been calculated using the CKD EPI equation. This calculation has not been validated in all clinical situations. eGFR's persistently <60 mL/min signify possible Chronic Kidney Disease.    Anion gap 7 5 - 15  Protime-INR     Status: None   Collection Time: 10/11/17 11:27 PM  Result Value Ref Range   Prothrombin Time 12.1 11.4 - 15.2 seconds   INR 0.90   Type and screen [place]     Status: None   Collection Time: 10/11/17 11:28 PM  Result Value Ref Range   ABO/RH(D) O POS    Antibody Screen NEG    Sample Expiration  10/14/2017   ABO/Rh     Status: None   Collection Time: 10/11/17 11:28 PM  Result Value Ref Range   ABO/RH(D)      O POS Performed at [facility], [place] [address] [place], [place] [address]   Glucose, capillary     Status: Abnormal   Collection Time: 10/12/17  4:02 AM  Result Value Ref  Range   Glucose-Capillary 157 (H) 65 - 99 mg/dL  Surgical PCR screen     Status: None   Collection Time: 10/12/17  6:48 AM  Result Value Ref Range   MRSA, PCR NEGATIVE NEGATIVE   Staphylococcus aureus NEGATIVE NEGATIVE    Comment: (NOTE) The Xpert SA Assay (FDA approved for NASAL specimens in patients 87 years of age and older), is one component of a comprehensive surveillance program. It is not intended to diagnose infection nor to guide or monitor treatment. Performed at [facility], [place] [address] [place], [place] [address]   Glucose, capillary     Status: Abnormal   Collection Time: 10/12/17  7:21 AM  Result Value Ref Range   Glucose-Capillary 114 (H) 65 - 99 mg/dL  Glucose, capillary     Status: Abnormal   Collection Time: 10/12/17 11:51 AM  Result Value Ref Range   Glucose-Capillary 135 (H) 65 - 99 mg/dL  Glucose, capillary     Status: Abnormal   Collection Time: 10/12/17  3:54 PM  Result Value Ref Range   Glucose-Capillary 134 (H) 65 - 99 mg/dL  Glucose, capillary     Status: Abnormal   Collection Time: 10/12/17  6:16 PM  Result Value Ref Range   Glucose-Capillary 179 (H) 65 - 99 mg/dL  Glucose, capillary     Status: Abnormal   Collection Time: 10/12/17  7:55 PM  Result Value Ref Range   Glucose-Capillary 211 (H) 65 - 99 mg/dL  Glucose, capillary     Status: Abnormal   Collection Time: 10/13/17 12:12 AM  Result Value Ref Range   Glucose-Capillary 249 (H) 65 - 99 mg/dL  Glucose, capillary     Status: Abnormal   Collection Time: 10/13/17  3:38 AM  Result Value Ref Range   Glucose-Capillary 199 (H) 65 - 99 mg/dL  CBC     Status: Abnormal    Collection Time: 10/13/17  6:07 AM  Result Value Ref Range   WBC 13.7 (H) 4.0 - 10.5 K/uL   RBC 3.43 (L) 4.22 - 5.81 MIL/uL   Hemoglobin 11.2 (L) 13.0 - 17.0 g/dL    Comment: REPEATED TO VERIFY DELTA CHECK NOTED    HCT 32.9 (L) 39.0 - 52.0 %   MCV 95.9 78.0 - 100.0 fL   MCH 32.7 26.0 - 34.0 pg   MCHC 34.0 30.0 - 36.0 g/dL   RDW 13.4 11.5 - 15.5 %   Platelets 143 (L) 150 - 400 K/uL    Comment: Performed at [facility], [place] [address] [place], [place] [address]  Glucose, capillary     Status: Abnormal   Collection Time: 10/13/17  7:52 AM  Result Value Ref Range   Glucose-Capillary 212 (H) 65 - 99 mg/dL  Basic metabolic panel     Status: Abnormal   Collection Time: 10/13/17 11:15 AM  Result Value Ref Range   Sodium 136 135 - 145 mmol/L   Potassium 3.8 3.5 - 5.1 mmol/L   Chloride 103 101 - 111 mmol/L   CO2 25 22 - 32 mmol/L   Glucose, Bld 192 (H) 65 - 99 mg/dL   BUN 12 6 - 20 mg/dL   Creatinine, Ser 0.87 0.61 - 1.24 mg/dL   Calcium 8.0 (L) 8.9 - 10.3 mg/dL   GFR calc non Af Amer >60 >60 mL/min   GFR calc Af Amer >60 >60 mL/min    Comment: (NOTE) The eGFR has been calculated using the CKD EPI equation. This  calculation has not been validated in all clinical situations. eGFR's persistently <60 mL/min signify possible Chronic Kidney Disease.    Anion gap 8 5 - 15    Comment: Performed at [facility], [place] [address] [place], [place] [address]  Glucose, capillary     Status: Abnormal   Collection Time: 10/13/17 11:23 AM  Result Value Ref Range   Glucose-Capillary 176 (H) 65 - 99 mg/dL  Glucose, capillary     Status: Abnormal   Collection Time: 10/13/17  4:49 PM  Result Value Ref Range   Glucose-Capillary 226 (H) 65 - 99 mg/dL  Glucose, capillary     Status: Abnormal   Collection Time: 10/13/17  8:21 PM  Result Value Ref Range   Glucose-Capillary 149 (H) 65 - 99 mg/dL  Glucose, capillary     Status: Abnormal   Collection Time:  10/13/17 11:55 PM  Result Value Ref Range   Glucose-Capillary 139 (H) 65 - 99 mg/dL   Comment 1 Notify RN    Comment 2 Document in Chart   Glucose, capillary     Status: Abnormal   Collection Time: 10/14/17  5:00 AM  Result Value Ref Range   Glucose-Capillary 160 (H) 65 - 99 mg/dL   Comment 1 Notify RN    Comment 2 Document in Chart   CBC     Status: Abnormal   Collection Time: 10/14/17  5:40 AM  Result Value Ref Range   WBC 12.3 (H) 4.0 - 10.5 K/uL   RBC 2.96 (L) 4.22 - 5.81 MIL/uL   Hemoglobin 9.5 (L) 13.0 - 17.0 g/dL   HCT 28.0 (L) 39.0 - 52.0 %   MCV 94.6 78.0 - 100.0 fL   MCH 32.1 26.0 - 34.0 pg   MCHC 33.9 30.0 - 36.0 g/dL   RDW 13.5 11.5 - 15.5 %   Platelets 157 150 - 400 K/uL    Comment: Performed at [facility], [place] [address] [place], [place] [address]  Glucose, capillary     Status: Abnormal   Collection Time: 10/14/17  7:41 AM  Result Value Ref Range   Glucose-Capillary 148 (H) 65 - 99 mg/dL  Glucose, capillary     Status: Abnormal   Collection Time: 10/14/17 12:28 PM  Result Value Ref Range   Glucose-Capillary 180 (H) 65 - 99 mg/dL  Glucose, capillary     Status: Abnormal   Collection Time: 10/14/17  5:11 PM  Result Value Ref Range   Glucose-Capillary 153 (H) 65 - 99 mg/dL  Glucose, capillary     Status: Abnormal   Collection Time: 10/14/17  7:23 PM  Result Value Ref Range   Glucose-Capillary 225 (H) 65 - 99 mg/dL  Glucose, capillary     Status: Abnormal   Collection Time: 10/15/17 12:14 AM  Result Value Ref Range   Glucose-Capillary 141 (H) 65 - 99 mg/dL  Glucose, capillary     Status: Abnormal   Collection Time: 10/15/17  5:10 AM  Result Value Ref Range   Glucose-Capillary 148 (H) 65 - 99 mg/dL  CBC     Status: Abnormal   Collection Time: 10/15/17  5:58 AM  Result Value Ref Range   WBC 9.3 4.0 - 10.5 K/uL   RBC 2.69 (L) 4.22 - 5.81 MIL/uL   Hemoglobin 8.8 (L) 13.0 - 17.0 g/dL   HCT 25.6 (L) 39.0 - 52.0 %   MCV 95.2 78.0 -  100.0 fL   MCH 32.7 26.0 - 34.0 pg  MCHC 34.4 30.0 - 36.0 g/dL   RDW 13.4 11.5 - 15.5 %   Platelets 163 150 - 400 K/uL    Comment: Performed at [facility], [place] [address], [place], [place] [address]  Glucose, capillary     Status: Abnormal   Collection Time: 10/15/17  7:10 AM  Result Value Ref Range   Glucose-Capillary 130 (H) 65 - 99 mg/dL  Glucose, capillary     Status: Abnormal   Collection Time: 10/15/17 11:27 AM  Result Value Ref Range   Glucose-Capillary 176 (H) 65 - 99 mg/dL  CBC and differential     Status: Abnormal   Collection Time: 10/19/17 12:00 AM  Result Value Ref Range   Hemoglobin 9.7 (A) 13.5 - 17.5   HCT 29 (A) 41 - 53   Platelets 381 150 - 399   WBC 82.9   Basic metabolic panel     Status: None   Collection Time: 10/19/17 12:00 AM  Result Value Ref Range   Glucose 160    BUN 17 4 - 21   Creatinine 0.6 0.6 - 1.3   Potassium 4.4 3.4 - 5.3   Sodium 140 137 - 147    Assessment/Plan: 82 year old male who status post total hip replacement now 3 and half weeks ago with persistent drainage from his right hip wound postoperatively.  He had Gram stain and culture taken of the fluid in the office yesterday and unfortunately this shows gram-positive cocci and gram-negative rods.//I had a long discussion with the patient today but I feel that the best and most reasonable course of action is to do an irrigation and debridement.  We are prepared to go deep into the wound and do a poly-swap and had removal with additional significant washing.  I think given the duration of drainage and the fact that it is a multiply colonized situation that wound VAC placement over the weekend is appropriate.  I will re-wash the wound and plan on closing it next week.  [name] 11/02/2017, 3:05 PM

## 2017-11-02 NOTE — Consult Note (Signed)
         [facility] for Infectious Disease    Date of Admission:  11/02/2017     Patient Active Problem List   Diagnosis Date Noted  . H/O total hip arthroplasty, right 11/02/2017    Priority: High  . Postoperative wound infection 11/02/2017    Priority: High  . Closed right hip fracture, initial encounter ([place]) 10/12/2017    Priority: High  . Acute blood loss as cause of postoperative anemia 10/17/2017  . Diabetes mellitus type 2 in obese ([place]) 10/12/2017  . Hypothyroidism 10/12/2017  . Essential hypertension 10/12/2017  . HLD (hyperlipidemia) 10/12/2017   Mr. Bridgers is an 82 year old gentleman who sustained a right femoral neck fracture after a fall on 10/11/2017.  He was admitted and underwent right total hip arthroplasty the following day and was discharged to a skilled nursing facility.  About 2 weeks postop he began to develop some wound drainage which has persisted.  He was seen in the office recently and wound drainage was sent for stain and culture. Gram stain showed gram-positive cocci and gram-negative rods.  He was admitted today and is currently in the operating room for incision and debridement.  I discussed the situation with Dr. Berenice Primas before surgery and have recommended empiric vancomycin and cefepime postop pending cultures and the results of surgery.  I will have my partner, Dr. Carlyle Basques [phone]) follow with you this weekend.           Michel Bickers, MD [facility] [phone] pager   [phone] cell 11/02/2017, 4:57 PM

## 2017-11-02 NOTE — Progress Notes (Signed)
Pharmacy Antibiotic Note  Nathan Pennington is a 82 y.o. male s/p R THA 10/12/17 admitted on 11/02/2017 for I&D of R hip.  Pharmacy has been consulted for vancomycin and cefepime dosing per ID recommendations.  Plan: Vancomycin 1g IV q12h Check vancomycin levels as indicated, goal AUC 400-500 Cefepime 2g IV q8h Follow up renal function & cultures  Height: 6' (182.9 cm) Weight: 229 lb (103.9 kg) IBW/kg (Calculated) : 77.6  Temp (24hrs), Avg:97.8 F (36.6 C), Min:97.4 F (36.3 C), Max:98.2 F (36.8 C)  Recent Labs  Lab 11/02/17 1505  WBC 7.8  CREATININE 0.68    Estimated Creatinine Clearance: 91.8 mL/min (by C-G formula based on SCr of 0.68 mg/dL).    Allergies  Allergen Reactions  . Morphine And Related Other (See Comments)    Became very mean and hallucinated    Antimicrobials this admission:  2/22 Vancomycin >> 2/22 Cefepime >>  Dose adjustments this admission:    Microbiology results:  2/22 R hip: 2/22 R hip (anaerobic):  Thank you for allowing pharmacy to be a part of this patient's care.  Peggyann Juba, PharmD, BCPS Pager: [phone] 11/02/2017 6:27 PM

## 2017-11-02 NOTE — Anesthesia Preprocedure Evaluation (Addendum)
Anesthesia Evaluation  Patient identified by MRN, date of birth, ID band Patient awake    Reviewed: Allergy & Precautions, NPO status , Patient's Chart, lab work & pertinent test results  Airway Mallampati: I  TM Distance: >3 FB Neck ROM: Full    Dental  (+) Teeth Intact, Poor Dentition, Dental Advisory Given   Pulmonary asthma , COPD,  COPD inhaler, former smoker,    breath sounds clear to auscultation       Cardiovascular hypertension, + Peripheral Vascular Disease   Rhythm:Regular Rate:Normal     Neuro/Psych TIA Neuromuscular disease negative psych ROS   GI/Hepatic negative GI ROS, Neg liver ROS,   Endo/Other  diabetes, Type 2, Oral Hypoglycemic AgentsHypothyroidism   Renal/GU negative Renal ROS     Musculoskeletal   Abdominal   Peds  Hematology   Anesthesia Other Findings - HLD  Reproductive/Obstetrics                            Lab Results  Component Value Date   WBC 10.8 10/19/2017   HGB 9.7 (A) 10/19/2017   HCT 29 (A) 10/19/2017   MCV 95.2 10/15/2017   PLT 381 10/19/2017   Lab Results  Component Value Date   CREATININE 0.6 10/19/2017   BUN 17 10/19/2017   NA 140 10/19/2017   K 4.4 10/19/2017   CL 103 10/13/2017   CO2 25 10/13/2017   Lab Results  Component Value Date   INR 0.90 10/11/2017   EKG: normal sinus rhythm.  Anesthesia Physical Anesthesia Plan  ASA: III  Anesthesia Plan: General   Post-op Pain Management:    Induction: Intravenous  PONV Risk Score and Plan: 3 and Ondansetron, Dexamethasone and Midazolam  Airway Management Planned: Oral ETT  Additional Equipment: None  Intra-op Plan:   Post-operative Plan: Extubation in OR  Informed Consent: I have reviewed the patients History and Physical, chart, labs and discussed the procedure including the risks, benefits and alternatives for the proposed anesthesia with the patient or authorized  representative who has indicated his/her understanding and acceptance.   Dental advisory given  Plan Discussed with: CRNA  Anesthesia Plan Comments:        Anesthesia Quick Evaluation

## 2017-11-02 NOTE — Anesthesia Postprocedure Evaluation (Signed)
Anesthesia Post Note  Patient: Nathan Pennington  Procedure(s) Performed: IRRIGATION AND DEBRIDEMENT RIGHT HIP WITH PLACEMENT OF WOUND VAC (Right Hip)     Patient location during evaluation: PACU Anesthesia Type: General Level of consciousness: awake and alert Pain management: pain level controlled Vital Signs Assessment: post-procedure vital signs reviewed and stable Respiratory status: spontaneous breathing, nonlabored ventilation, respiratory function stable and patient connected to nasal cannula oxygen Cardiovascular status: blood pressure returned to baseline and stable Postop Assessment: no apparent nausea or vomiting Anesthetic complications: no    Last Vitals:  Vitals:   11/02/17 1745 11/02/17 1800  BP: (!) 144/62 (!) 148/64  Pulse: 88 85  Resp: 13 10  Temp:    SpO2: 100% 100%    Last Pain:  Vitals:   11/02/17 1501  TempSrc: Oral                 Effie Berkshire

## 2017-11-03 ENCOUNTER — Encounter (HOSPITAL_COMMUNITY): Payer: Self-pay | Admitting: Orthopedic Surgery

## 2017-11-03 LAB — BASIC METABOLIC PANEL
Anion gap: 10 (ref 5–15)
BUN: 11 mg/dL (ref 6–20)
CALCIUM: 8.3 mg/dL — AB (ref 8.9–10.3)
CO2: 26 mmol/L (ref 22–32)
Chloride: 104 mmol/L (ref 101–111)
Creatinine, Ser: 0.66 mg/dL (ref 0.61–1.24)
GFR calc Af Amer: >60 mL/min (ref >60)
Glucose, Bld: 136 mg/dL — ABNORMAL HIGH (ref 65–99)
POTASSIUM: 4 mmol/L (ref 3.5–5.1)
SODIUM: 140 mmol/L (ref 135–145)

## 2017-11-03 LAB — GLUCOSE, CAPILLARY
Glucose-Capillary: 112 mg/dL — ABNORMAL HIGH (ref 65–99)
Glucose-Capillary: 172 mg/dL — ABNORMAL HIGH (ref 65–99)
Glucose-Capillary: 92 mg/dL (ref 65–99)
Glucose-Capillary: 101 mg/dL — ABNORMAL HIGH (ref 65–99)

## 2017-11-03 LAB — CBC WITH DIFFERENTIAL/PLATELET
BASOS ABS: 0 10*3/uL (ref 0.0–0.1)
BASOS PCT: 0 %
EOS ABS: 0 10*3/uL (ref 0.0–0.7)
EOS PCT: 0 %
HCT: 29.8 % — ABNORMAL LOW (ref 39.0–52.0)
Hemoglobin: 9.4 g/dL — ABNORMAL LOW (ref 13.0–17.0)
LYMPHS PCT: 8 %
Lymphs Abs: 0.9 10*3/uL (ref 0.7–4.0)
MCH: 31.5 pg (ref 26.0–34.0)
MCHC: 31.5 g/dL (ref 30.0–36.0)
MCV: 100 fL (ref 78.0–100.0)
Monocytes Absolute: 1.3 10*3/uL — ABNORMAL HIGH (ref 0.1–1.0)
Monocytes Relative: 12 %
Neutro Abs: 8.2 10*3/uL — ABNORMAL HIGH (ref 1.7–7.7)
Neutrophils Relative %: 80 %
PLATELETS: 470 10*3/uL — AB (ref 150–400)
RBC: 2.98 MIL/uL — ABNORMAL LOW (ref 4.22–5.81)
RDW: 14.3 % (ref 11.5–15.5)
WBC: 10.3 10*3/uL (ref 4.0–10.5)

## 2017-11-03 NOTE — Consult Note (Signed)
[facility]  Total days of antibiotics 2        Day 2 vanco        Day 2 cefepime               Reason for Consult: post operative wound infection   Referring Physician: graves  Principal Problem:   Postoperative wound infection Active Problems:   Closed right hip fracture, initial encounter ([place])   H/O total hip arthroplasty, right   Postoperative wound infection of right hip    HPI: Nathan Pennington is a 82 y.o. male  With hx of COPD, asthma, DM, TIA sustained a right femoral neck fracture after a fall on 10/11/2017.  He underwent right total hip arthroplasty on 2/1 and discharged on 2/4 to a skilled nursing facility for rehabilitation.  About 2 weeks postop he began to develop some wound drainage which has persisted.  He was seen in the office where he underwent aspiration and culture ofwound drainage . Gram stain showed gram-positive cocci and gram-negative rods.  Dr Berenice Primas admitted him on 2/22 for washout and wound vac placement. Prelim report sugges that he evacuated a large hematoma but did nto think the deep hardware was infected. Unclear if he entered the joint space. Will wait for final or report.The prelim cultures are showing GPC in prs. He was empirically started on vancomycin plus cefepime.  He reports he was close to going home from snf since he was doing well with rehab. His wife is a Marine scientist   Past Medical History:  Diagnosis Date  . Amblyopia   . Asthma   . Cataract   . COPD (chronic obstructive pulmonary disease) ([place])   . Diabetes mellitus without complication ([place])    TYPE 2  . Disease of thyroid gland   . Glaucoma   . History of TIA (transient ischemic attack)   . Hyperlipidemia   . Hypertension   . Hypothyroidism   . Hypothyroidism   . Peripheral neuropathy   . Prostate disease   . Retinal arterial branch occlusion, right   . TIA (transient ischemic attack)     Allergies:  Allergies  Allergen Reactions  . Morphine And  Related Other (See Comments)    Became very mean and hallucinated    MEDICATIONS: . aspirin EC  325 mg Oral BID  . brimonidine  1 drop Both Eyes Daily  . docusate sodium  100 mg Oral BID  . feeding supplement (GLUCERNA SHAKE)  237 mL Oral Daily  . fenofibrate  54 mg Oral Daily  . finasteride  5 mg Oral Daily  . glipiZIDE  2.5 mg Oral QAC breakfast  . insulin aspart  0-15 Units Subcutaneous TID WC  . latanoprost  1 drop Both Eyes QHS  . levothyroxine  75 mcg Oral QAC breakfast  . lisinopril  2.5 mg Oral Daily  . metFORMIN  1,000 mg Oral BID WC  . mometasone-formoterol  2 puff Inhalation BID  . rosuvastatin  20 mg Oral QHS  . tamsulosin  0.4 mg Oral QHS    Social History   Tobacco Use  . Smoking status: Former Smoker    Types: Cigarettes    Last attempt to quit: 01/21/1976    Years since quitting: 41.8  . Smokeless tobacco: Never Used  Substance Use Topics  . Alcohol use: Yes    Frequency: Never    Comment: occasional  . Drug use: No    Family History  Problem Relation Age of Onset  . Cancer Mother   . Diabetes Mellitus II Father   . Hypertension Father   . Heart disease Father   . Glaucoma Father   . Macular degeneration Neg Hx     Review of Systems  Constitutional: Negative for fever, chills, diaphoresis, activity change, appetite change, fatigue and unexpected weight change.  HENT: Negative for congestion, sore throat, rhinorrhea, sneezing, trouble swallowing and sinus pressure.  Eyes: Negative for photophobia and visual disturbance.  Respiratory: Negative for cough, chest tightness, shortness of breath, wheezing and stridor.  Cardiovascular: Negative for chest pain, palpitations and leg swelling.  Gastrointestinal: Negative for nausea, vomiting, abdominal pain, diarrhea, constipation, blood in stool, abdominal distention and anal bleeding.  Genitourinary: Negative for dysuria, hematuria, flank pain and difficulty urinating.  Musculoskeletal: Negative for  myalgias, back pain, joint swelling, arthralgias and gait problem.  Skin: Negative for color change, pallor, rash and wound.  Neurological: Negative for dizziness, tremors, weakness and light-headedness.  Hematological: Negative for adenopathy. Does not bruise/bleed easily.  Psychiatric/Behavioral: Negative for behavioral problems, confusion, sleep disturbance, dysphoric mood, decreased concentration and agitation.      OBJECTIVE: Temp:  [97.4 F (36.3 C)-98 F (36.7 C)] 98 F (36.7 C) (02/23 1330) Pulse Rate:  [74-103] 103 (02/23 1330) Resp:  [10-22] 18 (02/23 0730) BP: (128-162)/(39-75) 144/39 (02/23 1330) SpO2:  [93 %-100 %] 98 % (02/23 1330) Weight:  [229 lb (103.9 kg)] 229 lb (103.9 kg) (02/23 1330) Physical Exam  Constitutional: He is oriented to person, place, and time. He appears well-developed and well-nourished. No distress.  HENT: wears hearing aids Mouth/Throat: Oropharynx is clear and moist. No oropharyngeal exudate.  Cardiovascular: Normal rate, regular rhythm and normal heart sounds. Exam reveals no gallop and no friction rub.  No murmur heard.  Pulmonary/Chest: Effort normal and breath sounds normal. No respiratory distress. He has no wheezes.  Abdominal: Soft. Bowel sounds are normal. He exhibits no distension. There is no tenderness.  Lymphadenopathy:  He has no cervical adenopathy.  Neurological: He is alert and oriented to person, place, and time.  Skin: right hip has drain in place. Dressing in place no surrounding erythema Psychiatric: He has a normal mood and affect. His behavior is normal.     LABS: Results for orders placed or performed during the hospital encounter of 11/02/17 (from the past 48 hour(s))  CBC with Differential/Platelet     Status: Abnormal   Collection Time: 11/02/17  3:05 PM  Result Value Ref Range   WBC 7.8 4.0 - 10.5 K/uL   RBC 3.46 (L) 4.22 - 5.81 MIL/uL   Hemoglobin 10.7 (L) 13.0 - 17.0 g/dL   HCT 34.2 (L) 39.0 - 52.0 %   MCV  98.8 78.0 - 100.0 fL   MCH 30.9 26.0 - 34.0 pg   MCHC 31.3 30.0 - 36.0 g/dL   RDW 14.4 11.5 - 15.5 %   Platelets 522 (H) 150 - 400 K/uL   Neutrophils Relative % 68 %   Neutro Abs 5.3 1.7 - 7.7 K/uL   Lymphocytes Relative 16 %   Lymphs Abs 1.3 0.7 - 4.0 K/uL   Monocytes Relative 11 %   Monocytes Absolute 0.9 0.1 - 1.0 K/uL   Eosinophils Relative 4 %   Eosinophils Absolute 0.3 0.0 - 0.7 K/uL   Basophils Relative 1 %   Basophils Absolute 0.1 0.0 - 0.1 K/uL    Comment: Performed at [facility], [place] [address], [place], [place] [address]  C-reactive protein     Status: None   Collection Time: 11/02/17  3:05 PM  Result Value Ref Range   CRP <0.8 <1.0 mg/dL    Comment: Performed at [facility], [place] [address] [place], [place] [address]  Sedimentation rate     Status: Abnormal   Collection Time: 11/02/17  3:05 PM  Result Value Ref Range   Sed Rate 61 (H) 0 - 16 mm/hr    Comment: Performed at [facility], [place] [address] [place], [place] [address]  Basic metabolic panel     Status: Abnormal   Collection Time: 11/02/17  3:05 PM  Result Value Ref Range   Sodium 142 135 - 145 mmol/L   Potassium 4.4 3.5 - 5.1 mmol/L   Chloride 106 101 - 111 mmol/L   CO2 24 22 - 32 mmol/L   Glucose, Bld 103 (H) 65 - 99 mg/dL   BUN 13 6 - 20 mg/dL   Creatinine, Ser 0.68 0.61 - 1.24 mg/dL   Calcium 9.1 8.9 - 10.3 mg/dL   GFR calc non Af Amer >60 >60 mL/min   GFR calc Af Amer >60 >60 mL/min    Comment: (NOTE) The eGFR has been calculated using the CKD EPI equation. This calculation has not been validated in all clinical situations. eGFR's persistently <60 mL/min signify possible Chronic Kidney Disease.    Anion gap 12 5 - 15    Comment: Performed at [facility], [place] [address] [place], [place] [address]  Glucose, capillary     Status: None   Collection Time: 11/02/17  3:09 PM  Result Value Ref Range   Glucose-Capillary 95 65 - 99  mg/dL  Body fluid culture     Status: None (Preliminary result)   Collection Time: 11/02/17  4:22 PM  Result Value Ref Range   Specimen Description      HIP RIGHT Performed at [place] [address], [place], [place] [address]    Special Requests      NONE Performed at [facility], [place] [address], [place], [place] [address]    Gram Stain      FEW WBC PRESENT, PREDOMINANTLY PMN RARE GRAM POSITIVE COCCI IN PAIRS Gram Stain Report Called to,Read Back By and Verified With: J.GOOD AT 1810 ON 11/02/17 BY N.THOMPSON Performed at [facility], [place] [address], [place], [place] [address]    Culture      TOO YOUNG TO READ Performed at [facility], [place] [address], [place], [place] [address]    Report Status PENDING   Body fluid culture     Status: None (Preliminary result)   Collection Time: 11/02/17  4:39 PM  Result Value Ref Range   Specimen Description      HIP RIGHT Performed at [place] [address], [place], [place] [address]    Special Requests      NONE Performed at [facility], [place] [address], [place], [place] [address]    Gram Stain      RARE WBC PRESENT, PREDOMINANTLY PMN NO ORGANISMS SEEN Gram Stain Report Called to,Read Back By and Verified With: J.GOOD AT 1810 ON 11/02/17 BY N.THOMPSON Performed at [facility], [place] [address], [place], [place] [address]    Culture      NO GROWTH < 24 HOURS Performed at [place] [address], [place],  [address]  Report Status PENDING   Glucose, capillary     Status: Abnormal   Collection Time: 11/02/17  5:31 PM  Result Value Ref Range   Glucose-Capillary 118 (H) 65 - 99 mg/dL   Comment 1 Notify RN   Glucose, capillary     Status: Abnormal   Collection Time: 11/02/17  7:58 PM  Result Value Ref Range   Glucose-Capillary 137 (H) 65 - 99 mg/dL  Basic metabolic panel     Status:  Abnormal   Collection Time: 11/03/17  6:18 AM  Result Value Ref Range   Sodium 140 135 - 145 mmol/L   Potassium 4.0 3.5 - 5.1 mmol/L   Chloride 104 101 - 111 mmol/L   CO2 26 22 - 32 mmol/L   Glucose, Bld 136 (H) 65 - 99 mg/dL   BUN 11 6 - 20 mg/dL   Creatinine, Ser 0.66 0.61 - 1.24 mg/dL   Calcium 8.3 (L) 8.9 - 10.3 mg/dL   GFR calc non Af Amer >60 >60 mL/min   GFR calc Af Amer >60 >60 mL/min    Comment: (NOTE) The eGFR has been calculated using the CKD EPI equation. This calculation has not been validated in all clinical situations. eGFR's persistently <60 mL/min signify possible Chronic Kidney Disease.    Anion gap 10 5 - 15    Comment: Performed at [facility], [place] [address], [place], [place] [address]  CBC with Differential/Platelet     Status: Abnormal   Collection Time: 11/03/17  6:18 AM  Result Value Ref Range   WBC 10.3 4.0 - 10.5 K/uL   RBC 2.98 (L) 4.22 - 5.81 MIL/uL   Hemoglobin 9.4 (L) 13.0 - 17.0 g/dL   HCT 29.8 (L) 39.0 - 52.0 %   MCV 100.0 78.0 - 100.0 fL   MCH 31.5 26.0 - 34.0 pg   MCHC 31.5 30.0 - 36.0 g/dL   RDW 14.3 11.5 - 15.5 %   Platelets 470 (H) 150 - 400 K/uL   Neutrophils Relative % 80 %   Neutro Abs 8.2 (H) 1.7 - 7.7 K/uL   Lymphocytes Relative 8 %   Lymphs Abs 0.9 0.7 - 4.0 K/uL   Monocytes Relative 12 %   Monocytes Absolute 1.3 (H) 0.1 - 1.0 K/uL   Eosinophils Relative 0 %   Eosinophils Absolute 0.0 0.0 - 0.7 K/uL   Basophils Relative 0 %   Basophils Absolute 0.0 0.0 - 0.1 K/uL    Comment: Performed at [facility], [place] [address] [place], [place] [address]  Glucose, capillary     Status: Abnormal   Collection Time: 11/03/17  7:44 AM  Result Value Ref Range   Glucose-Capillary 112 (H) 65 - 99 mg/dL   Comment 1 Notify RN   Glucose, capillary     Status: Abnormal   Collection Time: 11/03/17 11:57 AM  Result Value Ref Range   Glucose-Capillary 172 (H) 65 - 99 mg/dL   Comment 1 Notify RN      MICRO: pending  Assessment/Plan:  82yo M with deep tissue infection associated with recent wound dehiscence of  Right THA  - continue on vancomycin. Will discontinue cefepime - await culture results to see if need to add rifampin - will place order for picc line - plan on treating for 4-6 wk depending on OR report - will get sed rate and crp

## 2017-11-03 NOTE — Progress Notes (Signed)
Subjective: 1 Day Post-Op Procedure(s) (LRB): IRRIGATION AND DEBRIDEMENT RIGHT HIP WITH PLACEMENT OF WOUND VAC (Right)   Patient feeling well and states he has no pain. His goal is to eventually go home with home health and not to SNF.  Activity level:  wbat Diet tolerance:  ok Voiding:  ok Patient reports pain as mild.    Objective: Vital signs in last 24 hours: Temp:  [97.4 F (36.3 C)-98.2 F (36.8 C)] 97.8 F (36.6 C) (02/23 0730) Pulse Rate:  [74-97] 97 (02/23 0730) Resp:  [10-22] 18 (02/23 0730) BP: (125-162)/(51-75) 131/51 (02/23 0730) SpO2:  [96 %-100 %] 99 % (02/23 0730) Weight:  [103.9 kg (229 lb)] 103.9 kg (229 lb) (02/22 1501)  Labs: Recent Labs    11/02/17 1505 11/03/17 0618  HGB 10.7* 9.4*   Recent Labs    11/02/17 1505 11/03/17 0618  WBC 7.8 10.3  RBC 3.46* 2.98*  HCT 34.2* 29.8*  PLT 522* 470*   Recent Labs    11/02/17 1505 11/03/17 0618  NA 142 140  K 4.4 4.0  CL 106 104  CO2 24 26  BUN 13 11  CREATININE 0.68 0.66  GLUCOSE 103* 136*  CALCIUM 9.1 8.3*   No results for input(s): LABPT, INR in the last 72 hours.  Physical Exam:  Neurologically intact ABD soft Neurovascular intact Sensation intact distally Intact pulses distally Dorsiflexion/Plantar flexion intact Incision: dressing C/D/I and Wound vac in place and charged. No cellulitis present Compartment soft  Assessment/Plan:  1 Day Post-Op Procedure(s) (LRB): IRRIGATION AND DEBRIDEMENT RIGHT HIP WITH PLACEMENT OF WOUND VAC (Right) Advance diet Up with therapy Discharge home with home health if doing well and cleared by PT potentiall Monday. We greatly appreciate ID teams help and management for this patient.  We will leave antibiotic decisions up to them. We will continue to follow patient closely.  Rasheema Truluck, Larwance Sachs 11/03/2017, 8:21 AM

## 2017-11-03 NOTE — Evaluation (Signed)
Physical Therapy Evaluation Patient Details Name: Nathan Pennington MRN: [id number] DOB: [birth year] Today's Date: 11/03/2017   History of Present Illness  The patient is an 82 year old male who was admitted approximately 3-1/2 weeks ago with femoral neck fracture.  He was a high functioning person and I felt the total hip replacement be appropriate.  He underwent total hip replacement and did well initiallly.  he went to SNF and within 2 weeks had drainage.  Pt now in for hip I&D.  pt is WBAT wtih no precautions  Clinical Impression  Pt did well today moving.  Good movement and control of operated leg with miinimal pain.  Pt was very happy about this and wanting to go home with [facility]    Follow Up Recommendations Home health PT    Equipment Recommendations  Rolling walker with 5" wheels(pt does not want 3n1 - he says his balls dont fit and it hurts him)    Recommendations for Other Services       Precautions / Restrictions Precautions Precautions: Fall Precaution Comments: pts original fall was when he went out to restraunt and booth was up on a step - he saw it coming in but fell down it going out Restrictions RLE Weight Bearing: Weight bearing as tolerated Other Position/Activity Restrictions: pt has no hip precautions      Mobility  Bed Mobility Overal bed mobility: Needs Assistance Bed Mobility: Supine to Sit     Supine to sit: Min assist     General bed mobility comments: I helped with operated leg only  Transfers Overall transfer level: Needs assistance Equipment used: Rolling walker (2 wheeled) Transfers: Sit to/from Stand Sit to Stand: Min assist Stand pivot transfers: Min assist       General transfer comment: cues for RW hand placment and safety.  pt got up and down from the lower toilet with rail and min assist- this was his preference from SNF  Ambulation/Gait Ambulation/Gait assistance: Min assist Ambulation Distance (Feet): 170 Feet Assistive device:  Rolling walker (2 wheeled) Gait Pattern/deviations: Step-through pattern     General Gait Details: pt walked with RW and equal stride length.  pt cued for erect trunk - pt abel to stand taller wtih frequent cues to lift his breast bone.  pt abel to put full weight on operated leg with min pain.    Stairs            Wheelchair Mobility    Modified Rankin (Stroke Patients Only)       Balance Overall balance assessment: No apparent balance deficits (not formally assessed)                                           Pertinent Vitals/Pain Pain Assessment: 0-10 Pain Score: 4  Pain Location: right hip Pain Descriptors / Indicators: (pulling and this can be due to the VAC taping) Pain Intervention(s): Repositioned;Monitored during session    [place] expects to be discharged to:: Private residence Living Arrangements: Spouse/significant other Available Help at Discharge: Family Type of Home: House Home Access: Stairs to enter Entrance Stairs-Rails: Can reach both Entrance Stairs-Number of Steps: 8 Home Layout: Two level;Bed/bath upstairs Home Equipment: Cane - single point;Walker - 4 wheels      Prior Function Level of Independence: Independent         Comments: states he drives, he has to  do all teh grocery shopping, house keeping. His wife is using a RW due to a back injury and they care for her elderly mom in the home.      Hand Dominance        Extremity/Trunk Assessment        Lower Extremity Assessment RLE Deficits / Details: right leg grossly weak but good alignment and leg not buckling in standing    Cervical / Trunk Assessment Cervical / Trunk Assessment: Kyphotic(gave pt a lot of cues for erect standing - he gets 3 inches taller when he lifts his breastbone when walking - encouraged him to remember this)  Communication   Communication: No difficulties  Cognition Arousal/Alertness: Awake/alert Behavior During  Therapy: WFL for tasks assessed/performed Overall Cognitive Status: Within Functional Limits for tasks assessed                                 General Comments: HOH      General Comments General comments (skin integrity, edema, etc.): pt with foley, Drain and Vac intact at end of PT treatment session    Exercises     Assessment/Plan    PT Assessment Patient needs continued PT services  PT Problem List Decreased strength;Decreased range of motion;Decreased activity tolerance;Decreased mobility       PT Treatment Interventions DME instruction;Gait training;Stair training;Functional mobility training;Therapeutic activities;Therapeutic exercise;Patient/family education    PT Goals (Current goals can be found in the Care Plan section)  Acute Rehab PT Goals Patient Stated Goal: pt wants to go straight home and recover there PT Goal Formulation: With patient Time For Goal Achievement: 11/10/17 Potential to Achieve Goals: Good    Frequency Min 6X/week   Barriers to discharge        Co-evaluation               AM-PAC PT "6 Clicks" Daily Activity  Outcome Measure Difficulty turning over in bed (including adjusting bedclothes, sheets and blankets)?: Unable Difficulty moving from lying on back to sitting on the side of the bed? : Unable Difficulty sitting down on and standing up from a chair with arms (e.g., wheelchair, bedside commode, etc,.)?: Unable Help needed moving to and from a bed to chair (including a wheelchair)?: A Little Help needed walking in hospital room?: A Little Help needed climbing 3-5 steps with a railing? : Total 6 Click Score: 10    End of Session Equipment Utilized During Treatment: Gait belt Activity Tolerance: Patient tolerated treatment well Patient left: in chair;with chair alarm set;with call bell/phone within reach Nurse Communication: Mobility status PT Visit Diagnosis: Other abnormalities of gait and mobility (R26.89);Pain     Time: 9892-1194 PT Time Calculation (min) (ACUTE ONLY): 55 min   Charges:   PT Evaluation $PT Eval Low Complexity: 1 Low PT Treatments $Gait Training: 23-37 mins   PT G Codes:        11/30/2017   Rande Lawman, PT   Loyal Buba November 30, 2017, 1:28 PM

## 2017-11-03 NOTE — Plan of Care (Signed)
  Nutrition: Adequate nutrition will be maintained 11/03/2017 1437 - Progressing by Dorene Sorrow, RN   Activity: Risk for activity intolerance will decrease 11/03/2017 1437 - Progressing by Dorene Sorrow, RN   Pain Managment: General experience of comfort will improve 11/03/2017 1437 - Progressing by Dorene Sorrow, RN

## 2017-11-04 LAB — BASIC METABOLIC PANEL
ANION GAP: 11 (ref 5–15)
BUN: 11 mg/dL (ref 6–20)
CO2: 25 mmol/L (ref 22–32)
CREATININE: 0.67 mg/dL (ref 0.61–1.24)
Calcium: 8.1 mg/dL — ABNORMAL LOW (ref 8.9–10.3)
Chloride: 105 mmol/L (ref 101–111)
GFR calc Af Amer: >60 mL/min (ref >60)
GLUCOSE: 117 mg/dL — AB (ref 65–99)
Potassium: 3.8 mmol/L (ref 3.5–5.1)
Sodium: 141 mmol/L (ref 135–145)

## 2017-11-04 LAB — CBC WITH DIFFERENTIAL/PLATELET
BASOS ABS: 0 10*3/uL (ref 0.0–0.1)
BASOS PCT: 1 %
EOS PCT: 4 %
Eosinophils Absolute: 0.3 10*3/uL (ref 0.0–0.7)
HCT: 27.4 % — ABNORMAL LOW (ref 39.0–52.0)
Hemoglobin: 8.8 g/dL — ABNORMAL LOW (ref 13.0–17.0)
Lymphocytes Relative: 14 %
Lymphs Abs: 1 10*3/uL (ref 0.7–4.0)
MCH: 32.1 pg (ref 26.0–34.0)
MCHC: 32.1 g/dL (ref 30.0–36.0)
MCV: 100 fL (ref 78.0–100.0)
MONO ABS: 1 10*3/uL (ref 0.1–1.0)
MONOS PCT: 14 %
Neutro Abs: 5.1 10*3/uL (ref 1.7–7.7)
Neutrophils Relative %: 67 %
PLATELETS: 375 10*3/uL (ref 150–400)
RBC: 2.74 MIL/uL — ABNORMAL LOW (ref 4.22–5.81)
RDW: 14.7 % (ref 11.5–15.5)
WBC: 7.5 10*3/uL (ref 4.0–10.5)

## 2017-11-04 LAB — GLUCOSE, CAPILLARY
GLUCOSE-CAPILLARY: 159 mg/dL — AB (ref 65–99)
Glucose-Capillary: 115 mg/dL — ABNORMAL HIGH (ref 65–99)
Glucose-Capillary: 135 mg/dL — ABNORMAL HIGH (ref 65–99)
Glucose-Capillary: 97 mg/dL (ref 65–99)

## 2017-11-04 MED ORDER — SODIUM CHLORIDE 0.9 % IV BOLUS (SEPSIS)
500.0000 mL | Freq: Once | INTRAVENOUS | Status: AC
  Administered 2017-11-04: 500 mL via INTRAVENOUS

## 2017-11-04 MED ORDER — SODIUM CHLORIDE 0.9% FLUSH: 10.0000 mL | INTRAVENOUS | Status: DC | PRN

## 2017-11-04 NOTE — Progress Notes (Signed)
Subjective: 2 Days Post-Op Procedure(s) (LRB): IRRIGATION AND DEBRIDEMENT RIGHT HIP WITH PLACEMENT OF WOUND VAC (Right)   patinet continues to feel better. His ultimate goal is to head home once safe.  Activity level:  wbat Diet tolerance:  ok Voiding:  ok Patient reports pain as mild.    Objective: Vital signs in last 24 hours: Temp:  [98 F (36.7 C)-98.5 F (36.9 C)] 98 F (36.7 C) (02/24 0428) Pulse Rate:  [90-103] 90 (02/24 0928) Resp:  [16-18] 18 (02/24 0428) BP: (116-144)/(49-59) 122/56 (02/24 0928) SpO2:  [97 %-99 %] 99 % (02/24 0928) Weight:  [103.9 kg (229 lb)] 103.9 kg (229 lb) (02/23 1330)  Labs: Recent Labs    11/02/17 1505 11/03/17 0618 11/04/17 0637  HGB 10.7* 9.4* 8.8*   Recent Labs    11/03/17 0618 11/04/17 0637  WBC 10.3 7.5  RBC 2.98* 2.74*  HCT 29.8* 27.4*  PLT 470* 375   Recent Labs    11/03/17 0618 11/04/17 0637  NA 140 141  K 4.0 3.8  CL 104 105  CO2 26 25  BUN 11 11  CREATININE 0.66 0.67  GLUCOSE 136* 117*  CALCIUM 8.3* 8.1*   No results for input(s): LABPT, INR in the last 72 hours.  Physical Exam:  Neurologically intact ABD soft Neurovascular intact Sensation intact distally Intact pulses distally Dorsiflexion/Plantar flexion intact Incision: Wound vac in place and charged. No cellulitis present Compartment soft  Assessment/Plan:  2 Days Post-Op Procedure(s) (LRB): IRRIGATION AND DEBRIDEMENT RIGHT HIP WITH PLACEMENT OF WOUND VAC (Right) Advance diet Up with therapy Discharge location will depend on how patient is doing and if he can get his antibiotics through his picc line. We greatly appreciate ID teams help and management for this patient.  We will leave antibiotic decisions up to them. We will continue to follow patient closely  Carnel Stegman, Larwance Sachs 11/04/2017, 10:22 AM

## 2017-11-04 NOTE — Progress Notes (Signed)
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 [facility] [phone]

## 2017-11-04 NOTE — Progress Notes (Signed)
[facility] Infusion Coordinator will follow pt with ID team to support home IV ABX at DC.   If patient discharges after hours, please call [phone].   Larry Sierras 11/04/2017, 9:35 PM

## 2017-11-04 NOTE — Progress Notes (Signed)
Peripherally Inserted Central Catheter/Midline Placement  The IV Nurse has discussed with the patient and/or persons authorized to consent for the patient, the purpose of this procedure and the potential benefits and risks involved with this procedure.  The benefits include less needle sticks, lab draws from the catheter, and the patient may be discharged home with the catheter. Risks include, but not limited to, infection, bleeding, blood clot (thrombus formation), and puncture of an artery; nerve damage and irregular heartbeat and possibility to perform a PICC exchange if needed/ordered by physician.  Alternatives to this procedure were also discussed.  Bard Power PICC patient education guide, fact sheet on infection prevention and patient information card has been provided to patient /or left at bedside.    PICC/Midline Placement Documentation  PICC Single Lumen 11/04/17 PICC Right Brachial 43 cm 0 cm (Active)  Indication for Insertion or Continuance of Line Home intravenous therapies (PICC only) 11/04/2017  8:00 AM  Exposed Catheter (cm) 0 cm 11/04/2017  8:00 AM  Site Assessment Clean;Dry;Intact 11/04/2017  8:00 AM  Line Status Flushed;Blood return noted;Saline locked 11/04/2017  8:00 AM  Dressing Type Transparent;Occlusive 11/04/2017  8:00 AM  Dressing Status Clean;Dry;Intact;Antimicrobial disc in place 11/04/2017  8:00 AM  Line Care Connections checked and tightened 11/04/2017  8:00 AM  Line Adjustment (NICU/IV Team Only) No 11/04/2017  8:00 AM  Dressing Intervention New dressing 11/04/2017  8:00 AM  Dressing Change Due 11/11/17 11/04/2017  8:00 AM       Alanson Puls, Keenan Bachelor 11/04/2017, 8:50 AM

## 2017-11-04 NOTE — Progress Notes (Signed)
Physical Therapy Treatment Patient Details Name: Nathan Pennington MRN: [id number] DOB: [birth year] Today's Date: 11/04/2017    History of Present Illness The patient is an 82 year old male who was admitted approximately 3-1/2 weeks ago with femoral neck fracture and underwent R THR.  Pt did well initiallly.  he went to SNF and within 2 weeks had drainage.  Pt now in for hip I&D.  pt is WBAT wtih no precautions    PT Comments    Pt continues cooperative and hoping to progress to dc home vs SNF.  Pt limited this date by c/o stiffness, fatigue and dizziness - BP 79/44 - RN aware.   Follow Up Recommendations  Home health PT     Equipment Recommendations  Rolling walker with 5" wheels    Recommendations for Other Services       Precautions / Restrictions Precautions Precautions: Fall Precaution Comments: pts original fall was when he went out to restraunt and booth was up on a step - he saw it coming in but fell down it going out Restrictions Weight Bearing Restrictions: No RLE Weight Bearing: Weight bearing as tolerated Other Position/Activity Restrictions: pt has no hip precautions    Mobility  Bed Mobility Overal bed mobility: Needs Assistance Bed Mobility: Supine to Sit     Supine to sit: Min assist     General bed mobility comments: Increased time with cues for sequence and use of L LE to self assist  Transfers Overall transfer level: Needs assistance Equipment used: Rolling walker (2 wheeled) Transfers: Sit to/from Stand Sit to Stand: Min assist         General transfer comment: cues for LE management and use of UEs to self assist  Ambulation/Gait Ambulation/Gait assistance: Min assist;Min guard Ambulation Distance (Feet): 170 Feet Assistive device: Rolling walker (2 wheeled) Gait Pattern/deviations: Step-through pattern Gait velocity: decreased Gait velocity interpretation: Below normal speed for age/gender General Gait Details: Increased time with cues  for posture and position from RW   Stairs            Wheelchair Mobility    Modified Rankin (Stroke Patients Only)       Balance                                            Cognition Arousal/Alertness: Awake/alert Behavior During Therapy: WFL for tasks assessed/performed Overall Cognitive Status: Within Functional Limits for tasks assessed                                 General Comments: HOH      Exercises      General Comments        Pertinent Vitals/Pain Pain Assessment: 0-10 Pain Score: 4  Pain Location: right hip Pain Intervention(s): Limited activity within patient's tolerance;Monitored during session    Home Living                      Prior Function            PT Goals (current goals can now be found in the care plan section) Acute Rehab PT Goals Patient Stated Goal: pt wants to go straight home and recover there PT Goal Formulation: With patient Time For Goal Achievement: 11/10/17 Potential to Achieve Goals: Good Progress towards PT goals: Progressing  toward goals    Frequency    Min 6X/week      PT Plan Current plan remains appropriate    Co-evaluation              AM-PAC PT "6 Clicks" Daily Activity  Outcome Measure  Difficulty turning over in bed (including adjusting bedclothes, sheets and blankets)?: Unable Difficulty moving from lying on back to sitting on the side of the bed? : Unable Difficulty sitting down on and standing up from a chair with arms (e.g., wheelchair, bedside commode, etc,.)?: Unable Help needed moving to and from a bed to chair (including a wheelchair)?: A Little Help needed walking in hospital room?: A Little Help needed climbing 3-5 steps with a railing? : A Little 6 Click Score: 12    End of Session Equipment Utilized During Treatment: Gait belt Activity Tolerance: Patient limited by fatigue Patient left: in chair;with chair alarm set;with call  bell/phone within reach Nurse Communication: Mobility status PT Visit Diagnosis: Other abnormalities of gait and mobility (R26.89);Pain     Time: 1200-1250 PT Time Calculation (min) (ACUTE ONLY): 50 min  Charges:  $Gait Training: 23-37 mins $Therapeutic Activity: 8-22 mins                    G Codes:       Pg [phone]    Eryk Beavers 11/04/2017, 2:01 PM

## 2017-11-04 NOTE — Plan of Care (Signed)
  Pain Managment: General experience of comfort will improve 11/04/2017 0208 - Progressing by Mickie Kay, RN

## 2017-11-05 ENCOUNTER — Encounter (HOSPITAL_COMMUNITY): Admission: RE | Payer: Self-pay | Source: Ambulatory Visit

## 2017-11-05 ENCOUNTER — Inpatient Hospital Stay (HOSPITAL_COMMUNITY): Admission: RE | Admit: 2017-11-05 | Payer: PPO | Source: Ambulatory Visit | Admitting: Orthopedic Surgery

## 2017-11-05 DIAGNOSIS — Z95828 Presence of other vascular implants and grafts: Secondary | ICD-10-CM

## 2017-11-05 DIAGNOSIS — T8451XA Infection and inflammatory reaction due to internal right hip prosthesis, initial encounter: Secondary | ICD-10-CM

## 2017-11-05 DIAGNOSIS — Z885 Allergy status to narcotic agent status: Secondary | ICD-10-CM

## 2017-11-05 DIAGNOSIS — S72001A Fracture of unspecified part of neck of right femur, initial encounter for closed fracture: Secondary | ICD-10-CM

## 2017-11-05 DIAGNOSIS — M25561 Pain in right knee: Secondary | ICD-10-CM

## 2017-11-05 DIAGNOSIS — X58XXXA Exposure to other specified factors, initial encounter: Secondary | ICD-10-CM

## 2017-11-05 LAB — BASIC METABOLIC PANEL
Anion gap: 12 (ref 5–15)
BUN: 13 mg/dL (ref 6–20)
CALCIUM: 8.5 mg/dL — AB (ref 8.9–10.3)
CHLORIDE: 105 mmol/L (ref 101–111)
CO2: 24 mmol/L (ref 22–32)
CREATININE: 0.63 mg/dL (ref 0.61–1.24)
GFR calc non Af Amer: >60 mL/min (ref >60)
GLUCOSE: 129 mg/dL — AB (ref 65–99)
Potassium: 3.8 mmol/L (ref 3.5–5.1)
Sodium: 141 mmol/L (ref 135–145)

## 2017-11-05 LAB — CBC WITH DIFFERENTIAL/PLATELET
BASOS PCT: 0 %
Basophils Absolute: 0 10*3/uL (ref 0.0–0.1)
EOS ABS: 0.2 10*3/uL (ref 0.0–0.7)
EOS PCT: 3 %
HCT: 27.6 % — ABNORMAL LOW (ref 39.0–52.0)
HEMOGLOBIN: 9 g/dL — AB (ref 13.0–17.0)
Lymphocytes Relative: 12 %
Lymphs Abs: 0.9 10*3/uL (ref 0.7–4.0)
MCH: 32.4 pg (ref 26.0–34.0)
MCHC: 32.6 g/dL (ref 30.0–36.0)
MCV: 99.3 fL (ref 78.0–100.0)
MONO ABS: 1 10*3/uL (ref 0.1–1.0)
MONOS PCT: 13 %
NEUTROS PCT: 72 %
Neutro Abs: 5.6 10*3/uL (ref 1.7–7.7)
Platelets: 358 10*3/uL (ref 150–400)
RBC: 2.78 MIL/uL — ABNORMAL LOW (ref 4.22–5.81)
RDW: 14.4 % (ref 11.5–15.5)
WBC: 7.7 10*3/uL (ref 4.0–10.5)

## 2017-11-05 LAB — GLUCOSE, CAPILLARY
Glucose-Capillary: 127 mg/dL — ABNORMAL HIGH (ref 65–99)
Glucose-Capillary: 155 mg/dL — ABNORMAL HIGH (ref 65–99)

## 2017-11-05 LAB — BODY FLUID CULTURE: CULTURE: NO GROWTH

## 2017-11-05 LAB — VANCOMYCIN, TROUGH: VANCOMYCIN TR: 12 ug/mL — AB (ref 15–20)

## 2017-11-05 SURGERY — IRRIGATION AND DEBRIDEMENT EXTREMITY
Anesthesia: Choice | Laterality: Right

## 2017-11-05 MED ORDER — CEFTRIAXONE IV (FOR PTA / DISCHARGE USE ONLY): 1.0000 g | INTRAVENOUS | 0 refills | Status: DC

## 2017-11-05 MED ORDER — SODIUM CHLORIDE 0.9 % IV SOLN
1.0000 g | INTRAVENOUS | Status: DC
  Administered 2017-11-05: 1 g via INTRAVENOUS
  Filled 2017-11-05: qty 1

## 2017-11-05 MED ORDER — ASPIRIN EC 325 MG PO TBEC: 325.0000 mg | DELAYED_RELEASE_TABLET | Freq: Every day | ORAL | 0 refills | Status: AC

## 2017-11-05 MED ORDER — HEPARIN SOD (PORK) LOCK FLUSH 100 UNIT/ML IV SOLN
250.0000 [IU] | Freq: Every day | INTRAVENOUS | Status: DC
  Filled 2017-11-05: qty 2.5

## 2017-11-05 MED ORDER — HEPARIN SOD (PORK) LOCK FLUSH 100 UNIT/ML IV SOLN
250.0000 [IU] | INTRAVENOUS | Status: DC | PRN
  Administered 2017-11-05: 250 [IU]
  Filled 2017-11-05: qty 2.5

## 2017-11-05 NOTE — Progress Notes (Signed)
Physical Therapy Treatment Patient Details Name: Nathan Pennington MRN: [id number] DOB: 17-Apr-1936 Today's Date: 11/05/2017    History of Present Illness The patient is an 82 year old male who was admitted approximately 3-1/2 weeks ago with femoral neck fracture and underwent R THR.  Pt did well initiallly.  he went to SNF and within 2 weeks had drainage.  Pt now in for hip I&D.  pt is WBAT wtih no precautions    PT Comments    Pt was able to self assist OOB with increased time as his plan is to D/C back home and spouse can not physically assist him. Assisted with amb a functional distance in hallway with walker.  Pt performing at safe mobility level to D/C back home.     Follow Up Recommendations  Home health PT     Equipment Recommendations  Rolling walker with 5" wheels    Recommendations for Other Services       Precautions / Restrictions Precautions Precautions: Fall Restrictions Weight Bearing Restrictions: No RLE Weight Bearing: Weight bearing as tolerated    Mobility  Bed Mobility Overal bed mobility: Needs Assistance       Supine to sit: Supervision     General bed mobility comments: able to self perform supine to to EOB with increased time and self thought  Transfers Overall transfer level: Needs assistance Equipment used: Rolling walker (2 wheeled) Transfers: Sit to/from Stand Sit to Stand: Supervision Stand pivot transfers: Supervision       General transfer comment: able to self perform and good use of hands to steady self  Ambulation/Gait Ambulation/Gait assistance: Supervision;Min guard Ambulation Distance (Feet): 125 Feet Assistive device: Rolling walker (2 wheeled) Gait Pattern/deviations: Step-through pattern Gait velocity: decreased   General Gait Details: Increased time with cues for posture and position from [employer]            Wheelchair Mobility    Modified Rankin (Stroke Patients Only)       Balance                                             Cognition Arousal/Alertness: Awake/alert Behavior During Therapy: WFL for tasks assessed/performed Overall Cognitive Status: Within Functional Limits for tasks assessed                                 General Comments: HOH      Exercises      General Comments        Pertinent Vitals/Pain Pain Assessment: Faces Faces Pain Scale: Hurts a little bit Pain Location: right hip Pain Descriptors / Indicators: Grimacing;Tender Pain Intervention(s): Monitored during session;Repositioned    Home Living                      Prior Function            PT Goals (current goals can now be found in the care plan section) Progress towards PT goals: Progressing toward goals    Frequency           PT Plan Current plan remains appropriate    Co-evaluation              AM-PAC PT "6 Clicks" Daily Activity  Outcome Measure  Difficulty turning over in bed (including adjusting  bedclothes, sheets and blankets)?: A Little Difficulty moving from lying on back to sitting on the side of the bed? : A Little Difficulty sitting down on and standing up from a chair with arms (e.g., wheelchair, bedside commode, etc,.)?: A Little Help needed moving to and from a bed to chair (including a wheelchair)?: A Little Help needed walking in hospital room?: A Little Help needed climbing 3-5 steps with a railing? : A Little 6 Click Score: 18    End of Session Equipment Utilized During Treatment: Gait belt Activity Tolerance: Patient tolerated treatment well Patient left: in chair;with chair alarm set;with call bell/phone within reach Nurse Communication: Mobility status PT Visit Diagnosis: Other abnormalities of gait and mobility (R26.89);Pain     Time: 0950-1015 PT Time Calculation (min) (ACUTE ONLY): 25 min  Charges:  $Gait Training: 8-22 mins $Therapeutic Activity: 8-22 mins                    G Codes:        Rica Koyanagi  PTA [facility]  Acute  Rehab Pager      [phone]

## 2017-11-05 NOTE — Addendum Note (Signed)
Addended by: Virgel Manifold on: 11/05/2017 04:28 PM   Modules accepted: Level of Service, SmartSet

## 2017-11-05 NOTE — Progress Notes (Signed)
PHARMACY CONSULT NOTE FOR:  OUTPATIENT  PARENTERAL ANTIBIOTIC THERAPY (OPAT)  Indication: wound infection Regimen: ceftriaxone 1gm IV q24h End date: 12/03/17  IV antibiotic discharge orders are pended. To discharging provider:  please sign these orders via discharge navigator,  Select New Orders & click on the button choice - Manage This Unsigned Work.     Thank you for allowing pharmacy to be a part of this patient's care.  Dolly Rias RPh 11/05/2017, 1:14 PM Pager [phone]

## 2017-11-05 NOTE — Op Note (Signed)
NAME:  Nathan Pennington, Nathan Pennington                ACCOUNT NO.:  MEDICAL RECORD NO.:  [id number]  LOCATION:                                 FACILITY:  PHYSICIAN:  [name], M.D.   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  POSTOPERATIVE DIAGNOSIS:  Infection versus superficial infection with deep seroma, right hip.  PROCEDURES: 1. Irrigation and debridement of right hip hematoma vs infection deep space. 2. Arthrotomy r hip joint with synovectomy with debridement 3. Placement of incisional wound VAC.  SURGEON:  [name], MD.  ASSISTANT:  Lind Guest. Ninfa Linden, MD.  BRIEF HISTORY:  [name] is an 82 year old male with a long history of having had total hip replacement for femoral neck fracture about 3- 1/2 weeks ago.  Postoperatively, he went to skilled nursing facility and we were evaluating him and noting him to have significant drainage.  It seemed to be sterile.  It seemed to be something that might resolve.  We were getting some clots out and I thought he might have a hematoma.  So, we continued to watch this, but unfortunately he continued to drain.  I did a culture in the office of his drainage and unfortunately it showed some Gram-positive cocci and Gram-negative bacilli.  I felt like that we just needed to wash him out and he was brought to the operating room for evaluation and washout.  Dr. Jean Rosenthal was around and was kind enough to come in to assist given his long-term history with anterior approach arthroplastic.  DESCRIPTION OF PROCEDURE:  The patient was brought to the operating room and after adequate anesthesia was obtained with a general anesthetic, the patient was placed supine on the operating table.  The right hip continued to  drain from 2 punctate areas of the wound following this. The incision was prepped and draped in the usual sterile fashion and he was placed on the Hana bed.  We made an incision through the old incision excising all of the skin and incising the draining sinuses and once this was done, we got through into the deep fascia.  There was a significant amount of hematoma that we encountered and we evacuated the hematoma.  The skin, the subcutaneous tissues, and deep tissues looked really quite good.  There was really no evidence of any kind of infectious process that seemed to be going on.  I debrided this thoroughly.  We took a 3-L bag and irrigated this deep space.  It was not clear that there was an entrance into the deeper depth of the wound, but I felt that given the concern that we needed to at least address that.  So, once we had irrigated thoroughly the subcutaneous space, we then opened the deep fascia and went into the hip joint, opened the capsule.  I put traction on the hip so that we could get separation of the ball from the poly, and at this point, we did encounter significant seroma and  we did send cultures for that, but I did not feel that this was an infectious process at all.  It did not look like an infectious process.  At this point, with internal and external rotation, we were able to really debride and do an extensive synovectomy of the hip joint and cleaned everything out through that area.  Once that was completed, I took Betadine and peroxide and cleaned the wound thoroughly and aggressively to get a greater bacteriostatic affect.  At this point, I closed the deep capsule.  I closed the tensor fascia over a drain and then I left a drain also in the subcutaneous space.  I did put on an incisional wound VAC and a sterile compressive dressing to try to help keep this wound as clean and dry as I could.  At this point, I had the deep layer closed with 2-0 Vicryl, 0 and 2-0  Vicryl, and then skin with skin staples.  Sterile compressive dressing was applied and the incisional wound VAC was applied at this point, and he was taken to the recovery room where he was noted to be in satisfactory condition. Estimated blood loss for the procedure was minimal.     [name], M.D.     Corliss Skains  D:  11/02/2017  T:  11/02/2017  Job:  [id number]

## 2017-11-05 NOTE — Care Management Important Message (Signed)
Important Message  Patient Details  Name: Nathan Pennington MRN: [id number] Date of Birth: 1936-01-03   Medicare Important Message Given:  Yes    Kerin Salen 11/05/2017, 11:08 AMImportant Message  Patient Details  Name: Nathan Pennington MRN: [id number] Date of Birth: 1936-05-27   Medicare Important Message Given:  Yes    Kerin Salen 11/05/2017, 11:08 AM

## 2017-11-05 NOTE — Clinical Social Work Note (Signed)
Clinical Social Work Assessment  Patient Details  Name: Nathan Pennington MRN: [id number] Date of Birth: September 08, 1936  Date of referral:  11/05/17               Reason for consult:  Facility Placement                Permission sought to share information with:  Family Supports Permission granted to share information::  Yes, Verbal Permission Granted  Name::     Customer service manager::     Relationship::  Spouse  Contact Information:     Housing/Transportation Living arrangements for the past 2 months:  Single Family Home Source of Information:  Patient, Spouse Patient Interpreter Needed:  None Criminal Activity/Legal Involvement Pertinent to Current Situation/Hospitalization:  No - Comment as needed Significant Relationships:  None Lives with:  Spouse Do you feel safe going back to the place where you live?  Yes Need for family participation in patient care:  Yes (Comment)  Care giving concerns:  No care giving concerns at the time of assessment.    Social Worker assessment / plan:  LCSW following for disposition. Patient admitted from rehab.  Patient was previously assessed and sent to short term rehab at [facility] following surgery on 10/15/17.  PT is not recommending Home Health.  Patient reports that he lives with his wife. Patient has a few stairs from his garage to his home and stairs to the second floor. Patient reports that he has a chair lift from the first to the second floor. Patient reports being independent in ADLs prior to surgery.   LCSW spoke with patients wife who states she is agreeable to home health as long as patient can get out of bed on his own. According to PT patient was able to get out of bed today.   LCSW notified facility of patients new disposition.   PLAN: Patient will dc home with home health.   Employment status:  Retired Nurse, adult PT Recommendations:  [place] / Referral to community  resources:     Patient/Family's Response to care:  Patient and family are proactive in care and wants what is best for the patient. Patient and family thankful for LCSW visit.   Patient/Family's Understanding of and Emotional Response to Diagnosis, Current Treatment, and Prognosis:  Patient and family understanding of current diagnosis and agreeable to treatment plan.   Emotional Assessment Appearance:  Appears stated age Attitude/Demeanor/Rapport:    Affect (typically observed):  Accepting, Calm, Pleasant Orientation:  Oriented to Self, Oriented to Place, Oriented to  Time, Oriented to Situation Alcohol / Substance use:  Not Applicable Psych involvement (Current and /or in the community):  No (Comment)  Discharge Needs  Concerns to be addressed:  No discharge needs identified Readmission within the last 30 days:  Yes Current discharge risk:  None Barriers to Discharge:  Continued Medical Work up   [employer], LCSW 11/05/2017, 10:37 AM

## 2017-11-05 NOTE — Progress Notes (Signed)
Patient ID: Nathan Pennington, male   DOB: [birth year], 82 y.o.   MRN: [id number]          [facility] for Infectious Disease  Date of Admission:  11/02/2017           Day 3 vancomycin        Day 3 ceftazidime ASSESSMENT: His right hip culture is growing E. coli sensitive to ceftriaxone.  The Gram stain was reported to be showing rare gram-positive cocci but on review no cocci were seen by other observers.  The culture specimen does not say whether or not it was a superficial or deep specimen.  I plan on switching to once daily ceftriaxone.  I will need to review the situation with Dr. Berenice Primas to determine optimal duration of treatment but I will plan on at least 4 weeks of IV ceftriaxone therapy.  PLAN: 1. Change vancomycin and ceftazidime to ceftriaxone  Diagnosis: Postoperative right hip wound infection  Culture Result: E. coli  Allergies  Allergen Reactions  . Morphine And Related Other (See Comments)    Became very mean and hallucinated    OPAT Orders Discharge antibiotics: Per pharmacy protocol ceftriaxone  Duration: 4 weeks End Date: 12/03/2017  [facility] Care Per Protocol:  Labs weekly while on IV antibiotics: _x_ CBC with differential _x_ BMP __ CMP _x_ CRP _x_ ESR __ Vancomycin trough  __ Please pull PIC at completion of IV antibiotics _x_ Please leave PIC in place until doctor has seen patient or been notified  Fax weekly labs to [phone]  Clinic Follow Up Appt: Arrange follow-up in my clinic within 4 weeks   Principal Problem:   Postoperative wound infection Active Problems:   Closed right hip fracture, initial encounter ([place])   H/O total hip arthroplasty, right   Postoperative wound infection of right hip   Scheduled Meds: . aspirin EC  325 mg Oral BID  . brimonidine  1 drop Both Eyes Daily  . docusate sodium  100 mg Oral BID  . feeding supplement (GLUCERNA SHAKE)  237 mL Oral Daily  . fenofibrate  54 mg Oral Daily  . finasteride  5  mg Oral Daily  . glipiZIDE  2.5 mg Oral QAC breakfast  . insulin aspart  0-15 Units Subcutaneous TID WC  . latanoprost  1 drop Both Eyes QHS  . levothyroxine  75 mcg Oral QAC breakfast  . lisinopril  2.5 mg Oral Daily  . metFORMIN  1,000 mg Oral BID WC  . mometasone-formoterol  2 puff Inhalation BID  . rosuvastatin  20 mg Oral QHS  . tamsulosin  0.4 mg Oral QHS   Continuous Infusions: . sodium chloride 50 mL/hr at 11/03/17 1920  . cefTRIAXone (ROCEPHIN)  IV    . methocarbamol (ROBAXIN)  IV     PRN Meds:.acetaminophen **OR** acetaminophen, albuterol, bisacodyl, HYDROcodone-acetaminophen, HYDROmorphone (DILAUDID) injection, magnesium citrate, methocarbamol **OR** methocarbamol (ROBAXIN)  IV, ondansetron **OR** ondansetron (ZOFRAN) IV, polyethylene glycol, sodium chloride flush   SUBJECTIVE: He is feeling better today.  He tells me that he was able to stand and walk without difficulty this morning when working with PT.  Review of Systems: Review of Systems  Constitutional: Negative for chills, diaphoresis and fever.  Gastrointestinal: Negative for abdominal pain, diarrhea, nausea and vomiting.  Musculoskeletal: Positive for joint pain.       He is having knee pain but no right hip pain.    Allergies  Allergen Reactions  . Morphine And Related Other (See Comments)  Became very mean and hallucinated    OBJECTIVE: Vitals:   11/04/17 2009 11/04/17 2141 11/05/17 0007 11/05/17 0423  BP: (!) 134/55  (!) 139/51 (!) 146/62  Pulse: 91  95 95  Resp: _0 Temp: 98.7 F (37.1 C)  98.2 F (36.8 C) 97.8 F (36.6 C)  TempSrc: Oral  Oral Oral  SpO2: 97% 96% 99% 98%  Weight:      Height:       Body mass index is 31.06 kg/m.  Physical Exam  Constitutional: He is oriented to person, place, and time.  He is in good spirits.  He is sitting up in a chair.  He is quite talkative this morning.  Musculoskeletal:  Clean dry dressing over right hip.  Neurological: He is alert and  oriented to person, place, and time.  Skin: No rash noted.  New right arm PICC.  Psychiatric: Mood and affect normal.    Lab Results Lab Results  Component Value Date   WBC 7.7 11/05/2017   HGB 9.0 (L) 11/05/2017   HCT 27.6 (L) 11/05/2017   MCV 99.3 11/05/2017   PLT 358 11/05/2017    Lab Results  Component Value Date   CREATININE 0.63 11/05/2017   BUN 13 11/05/2017   NA 141 11/05/2017   K 3.8 11/05/2017   CL 105 11/05/2017   CO2 24 11/05/2017    Lab Results  Component Value Date   ALT 13 (L) 10/11/2017   AST 20 10/11/2017   ALKPHOS 53 10/11/2017   BILITOT 0.7 10/11/2017     Microbiology: Recent Results (from the past 240 hour(s))  Body fluid culture     Status: None   Collection Time: 11/02/17  4:22 PM  Result Value Ref Range Status   Specimen Description   Final    HIP RIGHT Performed at [facility], [place] [address] [place], [place] [address]    Special Requests   Final    NONE Performed at [facility], [place] [address], [place], [place] [address]    Gram Stain   Final    FEW WBC PRESENT, PREDOMINANTLY PMN RARE GRAM POSITIVE COCCI IN PAIRS Gram Stain Report Called to,Read Back By and Verified With: J.GOOD AT 1810 ON 11/02/17 BY N.THOMPSON Performed at [facility], [place] [address], [place], [place] [address]    Culture RARE ESCHERICHIA COLI  Final   Report Status 11/05/2017 FINAL  Final   Organism ID, Bacteria ESCHERICHIA COLI  Final      Susceptibility   Escherichia coli - MIC*    AMPICILLIN >=32 RESISTANT Resistant     CEFAZOLIN >=64 RESISTANT Resistant     CEFEPIME <=1 SENSITIVE Sensitive     CEFTAZIDIME 16 INTERMEDIATE Intermediate     CEFTRIAXONE 8 SENSITIVE Sensitive     CIPROFLOXACIN <=0.25 SENSITIVE Sensitive     GENTAMICIN <=1 SENSITIVE Sensitive     IMIPENEM <=0.25 SENSITIVE Sensitive     TRIMETH/SULFA <=20 SENSITIVE Sensitive     AMPICILLIN/SULBACTAM >=32 RESISTANT Resistant      PIP/TAZO 8 SENSITIVE Sensitive     Extended ESBL NEGATIVE Sensitive     * RARE ESCHERICHIA COLI  Anaerobic culture     Status: None (Preliminary result)   Collection Time: 11/02/17  4:22 PM  Result Value Ref Range Status   Specimen Description   Final    HIP RIGHT Performed at [place] [address] [place], [place] [address]  Special Requests   Final    NONE Performed at [facility], [place] [address], [place], [place] [address]    Culture   Final    NO ANAEROBES ISOLATED; CULTURE IN PROGRESS FOR 5 DAYS   Report Status PENDING  Incomplete  Body fluid culture     Status: None   Collection Time: 11/02/17  4:39 PM  Result Value Ref Range Status   Specimen Description   Final    HIP RIGHT Performed at [place] [address] [place], [place] [address]    Special Requests   Final    NONE Performed at [facility], [place] [address], [place], [place] [address]    Gram Stain   Final    RARE WBC PRESENT, PREDOMINANTLY PMN NO ORGANISMS SEEN Gram Stain Report Called to,Read Back By and Verified With: J.GOOD AT 1810 ON 11/02/17 BY N.THOMPSON Performed at [facility], [place] [address] [place], [place] [address]    Culture   Final    NO GROWTH 3 DAYS Performed at [facility], [place] [address] [place], [place] [address]    Report Status 11/05/2017 FINAL  Final    Michel Bickers, MD [facility] [phone] pager   [phone] cell 11/05/2017, 11:54 AM

## 2017-11-05 NOTE — Progress Notes (Signed)
This encounter was created in error - please disregard.

## 2017-11-05 NOTE — Progress Notes (Signed)
Subjective: 3 Days Post-Op Procedure(s) (LRB): IRRIGATION AND DEBRIDEMENT RIGHT HIP WITH PLACEMENT OF WOUND VAC (Right) Patient reports pain as mild. Patient reports that he feels well and would like to go home with home health.   Objective: Vital signs in last 24 hours: Temp:  [97.8 F (36.6 C)-98.7 F (37.1 C)] 97.8 F (36.6 C) (02/25 0423) Pulse Rate:  [77-106] 95 (02/25 0423) Resp:  [17-20] 17 (02/25 0423) BP: (79-146)/(41-70) 146/62 (02/25 0423) SpO2:  [96 %-99 %] 98 % (02/25 0423)  Intake/Output from previous day: 02/24 0701 - 02/25 0700 In: 2676.7 [P.O.:750; I.V.:1226.7; IV Piggyback:700] Out: 1650 [Urine:850; Drains:800] Intake/Output this shift: No intake/output data recorded.  Recent Labs    11/02/17 1505 11/03/17 0618 11/04/17 0637 11/05/17 0358  HGB 10.7* 9.4* 8.8* 9.0*   Recent Labs    11/04/17 0637 11/05/17 0358  WBC 7.5 7.7  RBC 2.74* 2.78*  HCT 27.4* 27.6*  PLT 375 358   Recent Labs    11/04/17 0637 11/05/17 0358  NA 141 141  K 3.8 3.8  CL 105 105  CO2 25 24  BUN 11 13  CREATININE 0.67 0.63  GLUCOSE 117* 129*  CALCIUM 8.1* 8.5*    Specimen Description HIP RIGHT  Performed at [facility], [place] [address] [place], [place] [address]     Special Requests NONE  Performed at [facility], [place] [address], [place], [place] [address]     Gram Stain FEW WBC PRESENT, PREDOMINANTLY PMN  RARE GRAM POSITIVE COCCI IN PAIRS  Gram Stain Report Called to,Read Back By and Verified With: J.GOOD AT 1810 ON 11/02/17 BY N.THOMPSON  Performed at [facility], [place] [address] [place], [place] [address]     Culture RARE ESCHERICHIA COLI   Report Status 11/05/2017 FINAL   Organism ID, Bacteria ESCHERICHIA COLI   Resulting Agency [facility]  Susceptibility    Escherichia coli    MIC    AMPICILLIN >=32 RESIST... Resistant    AMPICILLIN/SULBACTAM >=32 RESIST... Resistant    CEFAZOLIN >=64 RESIST...  Resistant    CEFEPIME <=1 SENSITIVE  Sensitive    CEFTAZIDIME 16 INTERMED... Intermediate    CEFTRIAXONE 8 SENSITIVE  Sensitive    CIPROFLOXACIN <=0.25 SENS... Sensitive    Extended ESBL NEGATIVE  Sensitive    GENTAMICIN <=1 SENSITIVE  Sensitive    IMIPENEM <=0.25 SENS... Sensitive    PIP/TAZO 8 SENSITIVE  Sensitive    TRIMETH/SULFA <=20 SENSIT... Sensitive         Susceptibility Comments   Escherichia coli  RARE ESCHERICHIA COLI      Specimen Collected: 11/02/17 16:22 Last Resulted: 11/05/17 08:24    Right hip exam:  Hemovac drains intact 2.  Superficial wound VACis in place. Dressing is removed.  His wound looks very good today.  Staples are intact.  He did have some serous drainage from his drain holes after the drains were pulled.  His right calf is soft and nontender.  No wound redness.    Assessment/Plan: 3 Days Post-Op Procedure(s) (LRB): IRRIGATION AND DEBRIDEMENT RIGHT HIP WITH PLACEMENT OF WOUND VAC (Right)  Right hip infection S/P right total hip replacement for hip fracture. Plan: .  Antibiotic recommendations per ID.  We appreciate their help on this case. Patient would like to be discharged home with home health physical therapy and home health, RN. PICC line is already in place. We will get case management consult To set these things up.  Will need right  hip dressing change every 3 days with a dry 4 x 4 dressing and Tegaderm to seal the wound off. If these things can be set up today.  He can possibly  be discharged home this afternoon.  [place] G 11/05/2017, 10:44 AM

## 2017-11-05 NOTE — Progress Notes (Signed)
CM consult for home health services. [facility] following for IV abx at home. Pt offered choice for HHPT and [facility] chosen. [facility] rep alerted that HHPT would be needed in addition to IV abx. Will need MD orders for HHPT/RN at discharge. Marney Doctor RN,BSN,NCM [phone]

## 2017-11-06 DIAGNOSIS — H409 Unspecified glaucoma: Secondary | ICD-10-CM | POA: Diagnosis not present

## 2017-11-06 DIAGNOSIS — J449 Chronic obstructive pulmonary disease, unspecified: Secondary | ICD-10-CM | POA: Diagnosis not present

## 2017-11-06 DIAGNOSIS — E785 Hyperlipidemia, unspecified: Secondary | ICD-10-CM | POA: Diagnosis not present

## 2017-11-06 DIAGNOSIS — J45909 Unspecified asthma, uncomplicated: Secondary | ICD-10-CM | POA: Diagnosis not present

## 2017-11-06 DIAGNOSIS — B9620 Unspecified Escherichia coli [E. coli] as the cause of diseases classified elsewhere: Secondary | ICD-10-CM | POA: Diagnosis not present

## 2017-11-06 DIAGNOSIS — Z792 Long term (current) use of antibiotics: Secondary | ICD-10-CM | POA: Diagnosis not present

## 2017-11-06 DIAGNOSIS — Z87891 Personal history of nicotine dependence: Secondary | ICD-10-CM | POA: Diagnosis not present

## 2017-11-06 DIAGNOSIS — Z452 Encounter for adjustment and management of vascular access device: Secondary | ICD-10-CM | POA: Diagnosis not present

## 2017-11-06 DIAGNOSIS — I10 Essential (primary) hypertension: Secondary | ICD-10-CM | POA: Diagnosis not present

## 2017-11-06 DIAGNOSIS — Z8673 Personal history of transient ischemic attack (TIA), and cerebral infarction without residual deficits: Secondary | ICD-10-CM | POA: Diagnosis not present

## 2017-11-06 DIAGNOSIS — E1142 Type 2 diabetes mellitus with diabetic polyneuropathy: Secondary | ICD-10-CM | POA: Diagnosis not present

## 2017-11-06 DIAGNOSIS — T8451XA Infection and inflammatory reaction due to internal right hip prosthesis, initial encounter: Secondary | ICD-10-CM | POA: Diagnosis not present

## 2017-11-06 DIAGNOSIS — Z7982 Long term (current) use of aspirin: Secondary | ICD-10-CM | POA: Diagnosis not present

## 2017-11-06 DIAGNOSIS — E039 Hypothyroidism, unspecified: Secondary | ICD-10-CM | POA: Diagnosis not present

## 2017-11-06 DIAGNOSIS — B9689 Other specified bacterial agents as the cause of diseases classified elsewhere: Secondary | ICD-10-CM | POA: Diagnosis not present

## 2017-11-06 DIAGNOSIS — Z5181 Encounter for therapeutic drug level monitoring: Secondary | ICD-10-CM | POA: Diagnosis not present

## 2017-11-06 DIAGNOSIS — Z7984 Long term (current) use of oral hypoglycemic drugs: Secondary | ICD-10-CM | POA: Diagnosis not present

## 2017-11-07 LAB — ANAEROBIC CULTURE

## 2017-11-09 ENCOUNTER — Other Ambulatory Visit: Payer: Self-pay | Admitting: Licensed Clinical Social Worker

## 2017-11-09 NOTE — Discharge Summary (Addendum)
Patient ID: Nathan Pennington MRN: [id number] DOB/AGE: 80/17/1937 82 y.o.  Admit date: 11/02/2017 Discharge date: 11/05/2017  Admission Diagnoses:  Principal Problem:   Postoperative wound infection Active Problems:   Closed right hip fracture, initial encounter ([place])   H/O total hip arthroplasty, right   Postoperative wound infection of right hip   Discharge Diagnoses:  Same  Past Medical History:  Diagnosis Date  . Amblyopia   . Asthma   . Cataract   . COPD (chronic obstructive pulmonary disease) ([place])   . Diabetes mellitus without complication ([place])    TYPE 2  . Disease of thyroid gland   . Glaucoma   . History of TIA (transient ischemic attack)   . Hyperlipidemia   . Hypertension   . Hypothyroidism   . Hypothyroidism   . Peripheral neuropathy   . Prostate disease   . Retinal arterial branch occlusion, right   . TIA (transient ischemic attack)     Surgeries: Procedure(s): IRRIGATION AND DEBRIDEMENT RIGHT HIP WITH PLACEMENT OF WOUND VAC on 11/02/2017   Consultants: Infectious disease Michel Bickers MD  Discharged Condition: Improved  Hospital Course: Nathan Pennington is an 82 y.o. male who was admitted 11/02/2017 for operative treatment ofPostoperative wound infection right hip status post right total hip replacement. Patient has severe unremitting pain that affects sleep, daily activities, and work/hobbies. After pre-op clearance the patient was taken to the operating room on 11/02/2017 and underwent  Procedure(s): IRRIGATION AND DEBRIDEMENT RIGHT HIP WITH PLACEMENT OF WOUND VAC.    Patient was given perioperative antibiotics:  Anti-infectives (From admission, onward)   Start     Dose/Rate Route Frequency Ordered Stop   11/05/17 1200  cefTRIAXone (ROCEPHIN) 1 g in sodium chloride 0.9 % 100 mL IVPB  Status:  Discontinued     1 g 200 mL/hr over 30 Minutes Intravenous Every 24 hours 11/05/17 1123 11/05/17 1933   11/05/17 0000  cefTRIAXone (ROCEPHIN) IVPB     1 g  Intravenous Every 24 hours 11/05/17 1608 12/03/17 2359   11/02/17 2200  vancomycin (VANCOCIN) IVPB 1000 mg/200 mL premix  Status:  Discontinued     1,000 mg 200 mL/hr over 60 Minutes Intravenous Every 12 hours 11/02/17 1826 11/05/17 1123   11/02/17 2000  ceFEPIme (MAXIPIME) 2 g in sodium chloride 0.9 % 100 mL IVPB  Status:  Discontinued     2 g 200 mL/hr over 30 Minutes Intravenous Every 8 hours 11/02/17 1824 11/05/17 1123   11/02/17 1611  vancomycin (VANCOCIN) 1-5 GM/200ML-% IVPB    Comments:  Charmayne Sheer   : cabinet override      11/02/17 1611 11/03/17 0429   11/02/17 1507  ceFAZolin (ANCEF) 2-4 GM/100ML-% IVPB  Status:  Discontinued    Comments:  Cochran, Ron   : cabinet override      11/02/17 1507 11/02/17 1618       Patient was given sequential compression devices, early ambulation, and chemoprophylaxis to prevent DVT.  Infectious disease was consulted and antibiotic recommendations were made.  There assistance was greatly appreciated.  Patient benefited maximally from hospital stay and there were no complications.    Recent vital signs: See chart for details  Recent laboratory studies:  Recent Results (from the past 2160 hour(s))  CBC with Differential     Status: Abnormal   Collection Time: 10/11/17 11:27 PM  Result Value Ref Range   WBC 16.2 (H) 4.0 - 10.5 K/uL   RBC 4.57 4.22 - 5.81 MIL/uL  Hemoglobin 15.1 13.0 - 17.0 g/dL   HCT 43.6 39.0 - 52.0 %   MCV 95.4 78.0 - 100.0 fL   MCH 33.0 26.0 - 34.0 pg   MCHC 34.6 30.0 - 36.0 g/dL   RDW 13.2 11.5 - 15.5 %   Platelets 242 150 - 400 K/uL   Neutrophils Relative % 86 %   Neutro Abs 14.0 (H) 1.7 - 7.7 K/uL   Lymphocytes Relative 5 %   Lymphs Abs 0.7 0.7 - 4.0 K/uL   Monocytes Relative 9 %   Monocytes Absolute 1.4 (H) 0.1 - 1.0 K/uL   Eosinophils Relative 0 %   Eosinophils Absolute 0.1 0.0 - 0.7 K/uL   Basophils Relative 0 %   Basophils Absolute 0.0 0.0 - 0.1 K/uL  Comprehensive metabolic panel     Status: Abnormal    Collection Time: 10/11/17 11:27 PM  Result Value Ref Range   Sodium 141 135 - 145 mmol/L   Potassium 3.7 3.5 - 5.1 mmol/L   Chloride 108 101 - 111 mmol/L   CO2 26 22 - 32 mmol/L   Glucose, Bld 169 (H) 65 - 99 mg/dL   BUN 22 (H) 6 - 20 mg/dL   Creatinine, Ser 1.03 0.61 - 1.24 mg/dL   Calcium 8.9 8.9 - 10.3 mg/dL   Total Protein 6.8 6.5 - 8.1 g/dL   Albumin 3.9 3.5 - 5.0 g/dL   AST 20 15 - 41 U/L   ALT 13 (L) 17 - 63 U/L   Alkaline Phosphatase 53 38 - 126 U/L   Total Bilirubin 0.7 0.3 - 1.2 mg/dL   GFR calc non Af Amer >60 >60 mL/min   GFR calc Af Amer >60 >60 mL/min    Comment: (NOTE) The eGFR has been calculated using the CKD EPI equation. This calculation has not been validated in all clinical situations. eGFR's persistently <60 mL/min signify possible Chronic Kidney Disease.    Anion gap 7 5 - 15  Protime-INR     Status: None   Collection Time: 10/11/17 11:27 PM  Result Value Ref Range   Prothrombin Time 12.1 11.4 - 15.2 seconds   INR 0.90   Type and screen [place]     Status: None   Collection Time: 10/11/17 11:28 PM  Result Value Ref Range   ABO/RH(D) O POS    Antibody Screen NEG    Sample Expiration 10/14/2017   ABO/Rh     Status: None   Collection Time: 10/11/17 11:28 PM  Result Value Ref Range   ABO/RH(D)      O POS Performed at [facility], [place] [address], [place], [place] [address]   Glucose, capillary     Status: Abnormal   Collection Time: 10/12/17  4:02 AM  Result Value Ref Range   Glucose-Capillary 157 (H) 65 - 99 mg/dL  Surgical PCR screen     Status: None   Collection Time: 10/12/17  6:48 AM  Result Value Ref Range   MRSA, PCR NEGATIVE NEGATIVE   Staphylococcus aureus NEGATIVE NEGATIVE    Comment: (NOTE) The Xpert SA Assay (FDA approved for NASAL specimens in patients 81 years of age and older), is one component of a comprehensive surveillance program. It is not intended to diagnose infection nor  to guide or monitor treatment. Performed at [facility], [place] [address], [place], [place] [address]   Glucose, capillary     Status: Abnormal   Collection Time: 10/12/17  7:21 AM  Result Value Ref Range   Glucose-Capillary 114 (H) 65 - 99 mg/dL  Glucose, capillary     Status: Abnormal   Collection Time: 10/12/17 11:51 AM  Result Value Ref Range   Glucose-Capillary 135 (H) 65 - 99 mg/dL  Glucose, capillary     Status: Abnormal   Collection Time: 10/12/17  3:54 PM  Result Value Ref Range   Glucose-Capillary 134 (H) 65 - 99 mg/dL  Glucose, capillary     Status: Abnormal   Collection Time: 10/12/17  6:16 PM  Result Value Ref Range   Glucose-Capillary 179 (H) 65 - 99 mg/dL  Glucose, capillary     Status: Abnormal   Collection Time: 10/12/17  7:55 PM  Result Value Ref Range   Glucose-Capillary 211 (H) 65 - 99 mg/dL  Glucose, capillary     Status: Abnormal   Collection Time: 10/13/17 12:12 AM  Result Value Ref Range   Glucose-Capillary 249 (H) 65 - 99 mg/dL  Glucose, capillary     Status: Abnormal   Collection Time: 10/13/17  3:38 AM  Result Value Ref Range   Glucose-Capillary 199 (H) 65 - 99 mg/dL  CBC     Status: Abnormal   Collection Time: 10/13/17  6:07 AM  Result Value Ref Range   WBC 13.7 (H) 4.0 - 10.5 K/uL   RBC 3.43 (L) 4.22 - 5.81 MIL/uL   Hemoglobin 11.2 (L) 13.0 - 17.0 g/dL    Comment: REPEATED TO VERIFY DELTA CHECK NOTED    HCT 32.9 (L) 39.0 - 52.0 %   MCV 95.9 78.0 - 100.0 fL   MCH 32.7 26.0 - 34.0 pg   MCHC 34.0 30.0 - 36.0 g/dL   RDW 13.4 11.5 - 15.5 %   Platelets 143 (L) 150 - 400 K/uL    Comment: Performed at [facility], [place] [address], [place], [place] [address]  Glucose, capillary     Status: Abnormal   Collection Time: 10/13/17  7:52 AM  Result Value Ref Range   Glucose-Capillary 212 (H) 65 - 99 mg/dL  Basic metabolic panel     Status: Abnormal   Collection Time: 10/13/17 11:15 AM  Result Value Ref Range    Sodium 136 135 - 145 mmol/L   Potassium 3.8 3.5 - 5.1 mmol/L   Chloride 103 101 - 111 mmol/L   CO2 25 22 - 32 mmol/L   Glucose, Bld 192 (H) 65 - 99 mg/dL   BUN 12 6 - 20 mg/dL   Creatinine, Ser 0.87 0.61 - 1.24 mg/dL   Calcium 8.0 (L) 8.9 - 10.3 mg/dL   GFR calc non Af Amer >60 >60 mL/min   GFR calc Af Amer >60 >60 mL/min    Comment: (NOTE) The eGFR has been calculated using the CKD EPI equation. This calculation has not been validated in all clinical situations. eGFR's persistently <60 mL/min signify possible Chronic Kidney Disease.    Anion gap 8 5 - 15    Comment: Performed at [facility], [place] [address], [place], [place] [address]  Glucose, capillary     Status: Abnormal   Collection Time: 10/13/17 11:23 AM  Result Value Ref Range   Glucose-Capillary 176 (H) 65 - 99 mg/dL  Glucose, capillary     Status: Abnormal   Collection Time: 10/13/17  4:49 PM  Result Value Ref Range   Glucose-Capillary 226 (H) 65 - 99 mg/dL  Glucose, capillary     Status: Abnormal   Collection Time: 10/13/17  8:21 PM  Result Value Ref Range   Glucose-Capillary 149 (H) 65 - 99 mg/dL  Glucose, capillary     Status: Abnormal   Collection Time: 10/13/17 11:55 PM  Result Value Ref Range   Glucose-Capillary 139 (H) 65 - 99 mg/dL   Comment 1 Notify RN    Comment 2 Document in Chart   Glucose, capillary     Status: Abnormal   Collection Time: 10/14/17  5:00 AM  Result Value Ref Range   Glucose-Capillary 160 (H) 65 - 99 mg/dL   Comment 1 Notify RN    Comment 2 Document in Chart   CBC     Status: Abnormal   Collection Time: 10/14/17  5:40 AM  Result Value Ref Range   WBC 12.3 (H) 4.0 - 10.5 K/uL   RBC 2.96 (L) 4.22 - 5.81 MIL/uL   Hemoglobin 9.5 (L) 13.0 - 17.0 g/dL   HCT 28.0 (L) 39.0 - 52.0 %   MCV 94.6 78.0 - 100.0 fL   MCH 32.1 26.0 - 34.0 pg   MCHC 33.9 30.0 - 36.0 g/dL   RDW 13.5 11.5 - 15.5 %   Platelets 157 150 - 400 K/uL    Comment: Performed at [facility], [place] [address], [place], [place] [address]  Glucose, capillary     Status: Abnormal   Collection Time: 10/14/17  7:41 AM  Result Value Ref Range   Glucose-Capillary 148 (H) 65 - 99 mg/dL  Glucose, capillary     Status: Abnormal   Collection Time: 10/14/17 12:28 PM  Result Value Ref Range   Glucose-Capillary 180 (H) 65 - 99 mg/dL  Glucose, capillary     Status: Abnormal   Collection Time: 10/14/17  5:11 PM  Result Value Ref Range   Glucose-Capillary 153 (H) 65 - 99 mg/dL  Glucose, capillary     Status: Abnormal   Collection Time: 10/14/17  7:23 PM  Result Value Ref Range   Glucose-Capillary 225 (H) 65 - 99 mg/dL  Glucose, capillary     Status: Abnormal   Collection Time: 10/15/17 12:14 AM  Result Value Ref Range   Glucose-Capillary 141 (H) 65 - 99 mg/dL  Glucose, capillary     Status: Abnormal   Collection Time: 10/15/17  5:10 AM  Result Value Ref Range   Glucose-Capillary 148 (H) 65 - 99 mg/dL  CBC     Status: Abnormal   Collection Time: 10/15/17  5:58 AM  Result Value Ref Range   WBC 9.3 4.0 - 10.5 K/uL   RBC 2.69 (L) 4.22 - 5.81 MIL/uL   Hemoglobin 8.8 (L) 13.0 - 17.0 g/dL   HCT 25.6 (L) 39.0 - 52.0 %   MCV 95.2 78.0 - 100.0 fL   MCH 32.7 26.0 - 34.0 pg   MCHC 34.4 30.0 - 36.0 g/dL   RDW 13.4 11.5 - 15.5 %   Platelets 163 150 - 400 K/uL    Comment: Performed at [facility], [place] [address], [place], [place] [address]  Glucose, capillary     Status: Abnormal   Collection Time: 10/15/17  7:10 AM  Result Value Ref Range   Glucose-Capillary 130 (H) 65 - 99 mg/dL  Glucose, capillary     Status: Abnormal   Collection Time: 10/15/17 11:27 AM  Result Value Ref Range   Glucose-Capillary 176 (H) 65 - 99 mg/dL  CBC and differential     Status: Abnormal   Collection Time: 10/19/17 12:00 AM  Result Value  Ref Range   Hemoglobin 9.7 (A) 13.5 - 17.5   HCT 29 (A) 41 - 53   Platelets 381 150 - 399   WBC 91.7   Basic metabolic panel     Status: None    Collection Time: 10/19/17 12:00 AM  Result Value Ref Range   Glucose 160    BUN 17 4 - 21   Creatinine 0.6 0.6 - 1.3   Potassium 4.4 3.4 - 5.3   Sodium 140 137 - 147  CBC with Differential/Platelet     Status: Abnormal   Collection Time: 11/02/17  3:05 PM  Result Value Ref Range   WBC 7.8 4.0 - 10.5 K/uL   RBC 3.46 (L) 4.22 - 5.81 MIL/uL   Hemoglobin 10.7 (L) 13.0 - 17.0 g/dL   HCT 34.2 (L) 39.0 - 52.0 %   MCV 98.8 78.0 - 100.0 fL   MCH 30.9 26.0 - 34.0 pg   MCHC 31.3 30.0 - 36.0 g/dL   RDW 14.4 11.5 - 15.5 %   Platelets 522 (H) 150 - 400 K/uL   Neutrophils Relative % 68 %   Neutro Abs 5.3 1.7 - 7.7 K/uL   Lymphocytes Relative 16 %   Lymphs Abs 1.3 0.7 - 4.0 K/uL   Monocytes Relative 11 %   Monocytes Absolute 0.9 0.1 - 1.0 K/uL   Eosinophils Relative 4 %   Eosinophils Absolute 0.3 0.0 - 0.7 K/uL   Basophils Relative 1 %   Basophils Absolute 0.1 0.0 - 0.1 K/uL    Comment: Performed at [facility], [place] [address] [place], [place] [address]  C-reactive protein     Status: None   Collection Time: 11/02/17  3:05 PM  Result Value Ref Range   CRP <0.8 <1.0 mg/dL    Comment: Performed at [facility], [place] [address], [place], [place] [address]  Sedimentation rate     Status: Abnormal   Collection Time: 11/02/17  3:05 PM  Result Value Ref Range   Sed Rate 61 (H) 0 - 16 mm/hr    Comment: Performed at [facility], [place] [address], [place], [place] [address]  Basic metabolic panel     Status: Abnormal   Collection Time: 11/02/17  3:05 PM  Result Value Ref Range   Sodium 142 135 - 145 mmol/L   Potassium 4.4 3.5 - 5.1 mmol/L   Chloride 106 101 - 111 mmol/L   CO2 24 22 - 32 mmol/L   Glucose, Bld 103 (H) 65 - 99 mg/dL   BUN 13 6 - 20 mg/dL   Creatinine, Ser 0.68 0.61 - 1.24 mg/dL   Calcium 9.1 8.9 - 10.3 mg/dL   GFR calc non Af Amer >60 >60 mL/min   GFR calc Af Amer >60 >60 mL/min    Comment: (NOTE) The eGFR has been calculated  using the CKD EPI equation. This calculation has not been validated in all clinical situations. eGFR's persistently <60 mL/min signify possible Chronic Kidney Disease.    Anion gap 12 5 - 15    Comment: Performed at [facility], [place] [address] [place], [place] [address]  Glucose, capillary     Status: None   Collection Time: 11/02/17  3:09 PM  Result Value Ref Range   Glucose-Capillary 95 65 - 99 mg/dL  Body fluid culture     Status: None   Collection Time: 11/02/17  4:22 PM  Result Value Ref Range   Specimen Description  HIP RIGHT Performed at [facility], [place] [address] [place], [place] [address]    Special Requests      NONE Performed at [facility], [place] [address] [place], [place] [address]    Gram Stain      FEW WBC PRESENT, PREDOMINANTLY PMN RARE GRAM POSITIVE COCCI IN PAIRS Gram Stain Report Called to,Read Back By and Verified With: J.GOOD AT 1810 ON 11/02/17 BY N.THOMPSON Performed at [facility], [place] [address] [place], [place] [address]    Culture RARE ESCHERICHIA COLI    Report Status 11/05/2017 FINAL    Organism ID, Bacteria ESCHERICHIA COLI       Susceptibility   Escherichia coli - MIC*    AMPICILLIN >=32 RESISTANT Resistant     CEFAZOLIN >=64 RESISTANT Resistant     CEFEPIME <=1 SENSITIVE Sensitive     CEFTAZIDIME 16 INTERMEDIATE Intermediate     CEFTRIAXONE 8 SENSITIVE Sensitive     CIPROFLOXACIN <=0.25 SENSITIVE Sensitive     GENTAMICIN <=1 SENSITIVE Sensitive     IMIPENEM <=0.25 SENSITIVE Sensitive     TRIMETH/SULFA <=20 SENSITIVE Sensitive     AMPICILLIN/SULBACTAM >=32 RESISTANT Resistant     PIP/TAZO 8 SENSITIVE Sensitive     Extended ESBL NEGATIVE Sensitive     * RARE ESCHERICHIA COLI  Anaerobic culture     Status: None   Collection Time: 11/02/17  4:22 PM  Result Value Ref Range   Specimen Description      HIP RIGHT Performed at [place] [address] [place], [place] [address]    Special Requests      NONE Performed at [facility], [place] [address] [place], [place] [address]    Culture      NO ANAEROBES ISOLATED Performed at [facility], [place] [address], [place], [place] [address]    Report Status 11/07/2017 FINAL   Body fluid culture     Status: None   Collection Time: 11/02/17  4:39 PM  Result Value Ref Range   Specimen Description      HIP RIGHT Performed at [place] [address] [place], [place] [address]    Special Requests      NONE Performed at [facility], [place] [address], [place], [place] [address]    Gram Stain      RARE WBC PRESENT, PREDOMINANTLY PMN NO ORGANISMS SEEN Gram Stain Report Called to,Read Back By and Verified With: J.GOOD AT 1810 ON 11/02/17 BY N.THOMPSON Performed at [facility], [place] [address] [place], [place] [address]    Culture      NO GROWTH 3 DAYS Performed at [facility], [place] [address], [place],  [address]    Report Status 11/05/2017 FINAL   Glucose, capillary     Status: Abnormal   Collection Time: 11/02/17  5:31 PM  Result Value Ref Range   Glucose-Capillary 118 (H) 65 - 99 mg/dL   Comment 1 Notify RN   Glucose, capillary     Status: Abnormal   Collection Time: 11/02/17  7:58 PM  Result Value Ref Range   Glucose-Capillary 137 (H) 65 - 99 mg/dL  Basic metabolic panel     Status: Abnormal   Collection Time: 11/03/17  6:18 AM  Result Value Ref Range   Sodium 140 135 - 145 mmol/L   Potassium 4.0 3.5 - 5.1 mmol/L   Chloride 104 101 - 111  mmol/L   CO2 26 22 - 32 mmol/L   Glucose, Bld 136 (H) 65 - 99 mg/dL   BUN 11 6 - 20 mg/dL   Creatinine, Ser 0.66 0.61 - 1.24 mg/dL   Calcium 8.3 (L) 8.9 - 10.3 mg/dL   GFR calc non Af Amer >60 >60 mL/min   GFR calc Af Amer >60 >60 mL/min    Comment: (NOTE) The eGFR has been calculated using the CKD EPI equation. This  calculation has not been validated in all clinical situations. eGFR's persistently <60 mL/min signify possible Chronic Kidney Disease.    Anion gap 10 5 - 15    Comment: Performed at [facility], [place] [address] [place], [place] [address]  CBC with Differential/Platelet     Status: Abnormal   Collection Time: 11/03/17  6:18 AM  Result Value Ref Range   WBC 10.3 4.0 - 10.5 K/uL   RBC 2.98 (L) 4.22 - 5.81 MIL/uL   Hemoglobin 9.4 (L) 13.0 - 17.0 g/dL   HCT 29.8 (L) 39.0 - 52.0 %   MCV 100.0 78.0 - 100.0 fL   MCH 31.5 26.0 - 34.0 pg   MCHC 31.5 30.0 - 36.0 g/dL   RDW 14.3 11.5 - 15.5 %   Platelets 470 (H) 150 - 400 K/uL   Neutrophils Relative % 80 %   Neutro Abs 8.2 (H) 1.7 - 7.7 K/uL   Lymphocytes Relative 8 %   Lymphs Abs 0.9 0.7 - 4.0 K/uL   Monocytes Relative 12 %   Monocytes Absolute 1.3 (H) 0.1 - 1.0 K/uL   Eosinophils Relative 0 %   Eosinophils Absolute 0.0 0.0 - 0.7 K/uL   Basophils Relative 0 %   Basophils Absolute 0.0 0.0 - 0.1 K/uL    Comment: Performed at [facility], [place] [address] [place], [place] [address]  Glucose, capillary     Status: Abnormal   Collection Time: 11/03/17  7:44 AM  Result Value Ref Range   Glucose-Capillary 112 (H) 65 - 99 mg/dL   Comment 1 Notify RN   Glucose, capillary     Status: Abnormal   Collection Time: 11/03/17 11:57 AM  Result Value Ref Range   Glucose-Capillary 172 (H) 65 - 99 mg/dL   Comment 1 Notify RN   Glucose, capillary     Status: None   Collection Time: 11/03/17  5:14 PM  Result Value Ref Range   Glucose-Capillary 92 65 - 99 mg/dL   Comment 1 Notify RN   Glucose, capillary     Status: Abnormal   Collection Time: 11/03/17  8:30 PM  Result Value Ref Range   Glucose-Capillary 101 (H) 65 - 99 mg/dL  Basic metabolic panel     Status: Abnormal   Collection Time: 11/04/17  6:37 AM  Result Value Ref Range   Sodium 141 135 - 145 mmol/L   Potassium 3.8 3.5 - 5.1 mmol/L   Chloride 105 101  - 111 mmol/L   CO2 25 22 - 32 mmol/L   Glucose, Bld 117 (H) 65 - 99 mg/dL   BUN 11 6 - 20 mg/dL   Creatinine, Ser 0.67 0.61 - 1.24 mg/dL   Calcium 8.1 (L) 8.9 - 10.3 mg/dL   GFR calc non Af Amer >60 >60 mL/min   GFR calc Af Amer >60 >60 mL/min    Comment: (NOTE) The eGFR has been calculated using the CKD EPI equation. This calculation has not been validated in all clinical situations. eGFR's persistently <  60 mL/min signify possible Chronic Kidney Disease.    Anion gap 11 5 - 15    Comment: Performed at [facility], [place] [address] [place], [place] [address]  CBC with Differential/Platelet     Status: Abnormal   Collection Time: 11/04/17  6:37 AM  Result Value Ref Range   WBC 7.5 4.0 - 10.5 K/uL   RBC 2.74 (L) 4.22 - 5.81 MIL/uL   Hemoglobin 8.8 (L) 13.0 - 17.0 g/dL   HCT 27.4 (L) 39.0 - 52.0 %   MCV 100.0 78.0 - 100.0 fL   MCH 32.1 26.0 - 34.0 pg   MCHC 32.1 30.0 - 36.0 g/dL   RDW 14.7 11.5 - 15.5 %   Platelets 375 150 - 400 K/uL   Neutrophils Relative % 67 %   Neutro Abs 5.1 1.7 - 7.7 K/uL   Lymphocytes Relative 14 %   Lymphs Abs 1.0 0.7 - 4.0 K/uL   Monocytes Relative 14 %   Monocytes Absolute 1.0 0.1 - 1.0 K/uL   Eosinophils Relative 4 %   Eosinophils Absolute 0.3 0.0 - 0.7 K/uL   Basophils Relative 1 %   Basophils Absolute 0.0 0.0 - 0.1 K/uL    Comment: Performed at [facility], [place] [address], [place], [place] [address]  Glucose, capillary     Status: Abnormal   Collection Time: 11/04/17  8:59 AM  Result Value Ref Range   Glucose-Capillary 115 (H) 65 - 99 mg/dL   Comment 1 Notify RN   Glucose, capillary     Status: Abnormal   Collection Time: 11/04/17 12:32 PM  Result Value Ref Range   Glucose-Capillary 159 (H) 65 - 99 mg/dL   Comment 1 Notify RN   Glucose, capillary     Status: Abnormal   Collection Time: 11/04/17  5:01 PM  Result Value Ref Range   Glucose-Capillary 135 (H) 65 - 99 mg/dL   Comment 1 Notify RN    Glucose, capillary     Status: None   Collection Time: 11/04/17  8:07 PM  Result Value Ref Range   Glucose-Capillary 97 65 - 99 mg/dL  Basic metabolic panel     Status: Abnormal   Collection Time: 11/05/17  3:58 AM  Result Value Ref Range   Sodium 141 135 - 145 mmol/L   Potassium 3.8 3.5 - 5.1 mmol/L   Chloride 105 101 - 111 mmol/L   CO2 24 22 - 32 mmol/L   Glucose, Bld 129 (H) 65 - 99 mg/dL   BUN 13 6 - 20 mg/dL   Creatinine, Ser 0.63 0.61 - 1.24 mg/dL   Calcium 8.5 (L) 8.9 - 10.3 mg/dL   GFR calc non Af Amer >60 >60 mL/min   GFR calc Af Amer >60 >60 mL/min    Comment: (NOTE) The eGFR has been calculated using the CKD EPI equation. This calculation has not been validated in all clinical situations. eGFR's persistently <60 mL/min signify possible Chronic Kidney Disease.    Anion gap 12 5 - 15    Comment: Performed at [facility], [place] [address] [place], [place] [address]  CBC with Differential/Platelet     Status: Abnormal   Collection Time: 11/05/17  3:58 AM  Result Value Ref Range   WBC 7.7 4.0 - 10.5 K/uL   RBC 2.78 (L) 4.22 - 5.81 MIL/uL   Hemoglobin 9.0 (L) 13.0 - 17.0 g/dL   HCT 27.6 (L) 39.0 - 52.0 %   MCV 99.3  78.0 - 100.0 fL   MCH 32.4 26.0 - 34.0 pg   MCHC 32.6 30.0 - 36.0 g/dL   RDW 14.4 11.5 - 15.5 %   Platelets 358 150 - 400 K/uL   Neutrophils Relative % 72 %   Neutro Abs 5.6 1.7 - 7.7 K/uL   Lymphocytes Relative 12 %   Lymphs Abs 0.9 0.7 - 4.0 K/uL   Monocytes Relative 13 %   Monocytes Absolute 1.0 0.1 - 1.0 K/uL   Eosinophils Relative 3 %   Eosinophils Absolute 0.2 0.0 - 0.7 K/uL   Basophils Relative 0 %   Basophils Absolute 0.0 0.0 - 0.1 K/uL    Comment: Performed at [facility], [place] [address] [place], [place] [address]  Glucose, capillary     Status: Abnormal   Collection Time: 11/05/17  7:48 AM  Result Value Ref Range   Glucose-Capillary 127 (H) 65 - 99 mg/dL  Vancomycin, trough     Status: Abnormal    Collection Time: 11/05/17  9:56 AM  Result Value Ref Range   Vancomycin Tr 12 (L) 15 - 20 ug/mL    Comment: Performed at [facility], [place] [address] [place], [place] [address]  Glucose, capillary     Status: Abnormal   Collection Time: 11/05/17 11:50 AM  Result Value Ref Range   Glucose-Capillary 155 (H) 65 - 99 mg/dL   Discharge Medications:   Allergies as of 11/05/2017      Reactions   Morphine And Related Other (See Comments)   Became very mean and hallucinated      Medication List    STOP taking these medications   cephALEXin 500 MG capsule Commonly known as:  KEFLEX   HYDROcodone-acetaminophen 5-325 MG tablet Commonly known as:  NORCO     TAKE these medications   ALPHAGAN P 0.1 % Soln Generic drug:  brimonidine Place 1 drop into both eyes daily.   aspirin EC 325 MG tablet Take 1 tablet (325 mg total) by mouth daily. To decrease risk of blood clots. Take x 37monthpost op. What changed:  when to take this   cefTRIAXone IVPB Commonly known as:  ROCEPHIN Inject 1 g into the vein daily for 28 days. Indication:  Wound infection Last Day of Therapy:  12/03/17 Labs - Once weekly:  CBC/D and BMP, Labs - Every other week:  ESR and CRP   docusate sodium 100 MG capsule Commonly known as:  COLACE Take 1 capsule (100 mg total) by mouth 2 (two) times daily.   fenofibrate 48 MG tablet Commonly known as:  TRICOR Take 48 mg by mouth daily.   finasteride 5 MG tablet Commonly known as:  PROSCAR Take 5 mg by mouth daily.   FLAX SEEDS PO Take 1 capsule by mouth daily.   glipiZIDE 2.5 MG 24 hr tablet Commonly known as:  GLUCOTROL XL Take 2.5 mg by mouth daily.   GLUCERNA Liqd Take 237 mLs by mouth daily.   latanoprost 0.005 % ophthalmic solution Commonly known as:  XALATAN Place 1 drop into both eyes at bedtime.   levothyroxine 75 MCG tablet Commonly known as:  SYNTHROID, LEVOTHROID Take 75 mcg by mouth daily.   lisinopril 2.5 MG  tablet Commonly known as:  PRINIVIL,ZESTRIL Take 2.5 mg by mouth daily.   meclizine 25 MG tablet Commonly known as:  ANTIVERT Take 25 mg by mouth as needed. Travel sickness   metFORMIN 1000 MG tablet Commonly known as:  GLUCOPHAGE Take 1,000  mg by mouth 2 (two) times daily.   omega-3 acid ethyl esters 1 g capsule Commonly known as:  LOVAZA Take 1 g by mouth 2 (two) times daily.   PROVENTIL HFA 108 (90 Base) MCG/ACT inhaler Generic drug:  albuterol Inhale 2 puffs into the lungs every 6 (six) hours as needed for shortness of breath.   rosuvastatin 20 MG tablet Commonly known as:  CRESTOR Take 20 mg by mouth daily.   SYMBICORT 160-4.5 MCG/ACT inhaler Generic drug:  budesonide-formoterol Inhale 2 puffs into the lungs 2 (two) times daily.   tamsulosin 0.4 MG Caps capsule Commonly known as:  FLOMAX Take 0.4 mg by mouth daily.   tiZANidine 2 MG tablet Commonly known as:  ZANAFLEX Take 1 tablet (2 mg total) by mouth every 8 (eight) hours as needed for muscle spasms.            Home Infusion Instuctions  (From admission, onward)        Start     Ordered   11/05/17 0000  Home infusion instructions [facility] May follow [place] Dosing Protocol; May administer Cathflo as needed to maintain patency of vascular access device.; Flushing of vascular access device: per [facility] Protocol: 0.9% NaCl pre/post medica...    Question Answer Comment  Instructions May follow [place] Dosing Protocol   Instructions May administer Cathflo as needed to maintain patency of vascular access device.   Instructions Flushing of vascular access device: per [facility] Protocol: 0.9% NaCl pre/post medication administration and prn patency; Heparin 100 u/ml, 52m for implanted ports and Heparin 10u/ml, 59mfor all other central venous catheters.   Instructions May follow [facility] Anaphylaxis Protocol for First Dose Administration in the home: 0.9% NaCl at 25-50 ml/hr to maintain IV access for protocol  meds. Epinephrine 0.3 ml IV/IM PRN and Benadryl 25-50 IV/IM PRN s/s of anaphylaxis.   Instructions [facility] Infusion Coordinator (RN) to assist per patient IV care needs in the home PRN.      11/05/17 1608       Discharge Care Instructions  (From admission, onward)        Start     Ordered   11/05/17 0000  Weight bearing as tolerated    Question Answer Comment  Laterality right   Extremity Lower      11/05/17 1253      Diagnostic Studies: Dg Chest 2 View  Result Date: 10/11/2017 CLINICAL DATA:  89/o  M; status post fall.  Preop. EXAM: CHEST  2 VIEW COMPARISON:  None. FINDINGS: Normal cardiac silhouette. Aortic atherosclerosis with calcification. Clear lungs. No pleural effusion or pneumothorax. No acute osseous abnormality is evident. IMPRESSION: No acute pulmonary process identified. Electronically Signed   By: LaKristine Garbe.D.   On: 10/11/2017 23:27   Dg Pelvis 1-2 Views  Result Date: 10/13/2017 CLINICAL DATA:  Status post right hip replacement October 12, 2017. EXAM: PELVIS - 1-2 VIEW COMPARISON:  None. FINDINGS: Patient is status post right hip replacement. Acetabular and femoral components are in good position. Soft tissue gas and skin staples consistent with interval surgery. No other acute abnormalities. IMPRESSION: Postoperative changes in the right hip. Electronically Signed   By: DaDorise BullionII M.D   On: 10/13/2017 09:03   Dg C-arm 1-60 Min-no Report  Result Date: 10/12/2017 Fluoroscopy was utilized by the requesting physician.  No radiographic interpretation.   Dg Hip Unilat  With Pelvis 2-3 Views Right  Result Date: 10/11/2017 CLINICAL DATA:  Generalized  right hip pain after a fall. EXAM: DG HIP (WITH OR WITHOUT PELVIS) 2-3V RIGHT COMPARISON:  None. FINDINGS: Transverse fracture of the right femoral neck with varus angulation of the fracture fragments. No dislocation at the hip joint. Pelvis appears intact. Mild degenerative changes in the  lower lumbar spine and both hips. IMPRESSION: Acute transverse fracture of the right femoral neck with varus angulation. Electronically Signed   By: Lucienne Capers M.D.   On: 10/11/2017 22:36    Disposition: 01-Home or Self Care  Discharge Instructions    Call MD / Call 911   Complete by:  As directed    If you experience chest pain or shortness of breath, CALL 911 and be transported to the hospital emergency room.  If you develope a fever above 101 F, pus (white drainage) or increased drainage or redness at the wound, or calf pain, call your surgeon's office.   Diet Carb Modified   Complete by:  As directed    Home infusion instructions [facility] May follow [place] Dosing Protocol; May administer Cathflo as needed to maintain patency of vascular access device.; Flushing of vascular access device: per [facility] Protocol: 0.9% NaCl pre/post medica...   Complete by:  As directed    Instructions:  May follow [place] Dosing Protocol   Instructions:  May administer Cathflo as needed to maintain patency of vascular access device.   Instructions:  Flushing of vascular access device: per [facility] Protocol: 0.9% NaCl pre/post medication administration and prn patency; Heparin 100 u/ml, 68m for implanted ports and Heparin 10u/ml, 5105mfor all other central venous catheters.   Instructions:  May follow [facility] Anaphylaxis Protocol for First Dose Administration in the home: 0.9% NaCl at 25-50 ml/hr to maintain IV access for protocol meds. Epinephrine 0.3 ml IV/IM PRN and Benadryl 25-50 IV/IM PRN s/s of anaphylaxis.   Instructions:  [facility] Coord[facility] (RN) to assist per patient IV care needs in the home PRN.   Increase activity slowly as tolerated   Complete by:  As directed    Weight bearing as tolerated   Complete by:  As directed    Laterality:  right   Extremity:  Lower      Follow-up Information    GrDorna LeitzMD. Schedule an appointment as soon as possible for a visit  in 2 week(s).   Specialty:  Orthopedic Surgery Contact information: [id number]   Specialty:  Home Health Services Contact information: [id number] [phone]      CaMichel BickersMD. Schedule an appointment as soon as possible for a visit in 4 weeks.   Specialty:  Infectious Diseases Contact information: [address] [phone]            Signed: BEJAHMEER, PORCHE/09/2017, 2:30 PM

## 2017-11-09 NOTE — Patient Outreach (Signed)
[facility]) Care Management  11/09/2017  Nathan Pennington 04/06/1936 [id number]  Assessment- CSW completed chart review and became aware that patient has already discharged back home from [facility]. CSW completed outreach call to patient and spouse answered. HIPPA verifications provided. CSW introduced self, reason for call and explained [name] social work involvement. Spouse reports that patient is doing well since he discharged back home. She shares that [facility] is involved and patient has all the medical equipment that he needs. Family deny any social work, nursing and pharmacy needs at this time and does not wish to be involved with [facility] as they feel overwhelmed with the amount of people involved in family's care already and feel comfortable with those services that already in place. Contact information provided in the case that family changes their mind in the future. [name] CSW will complete case closure at this time.   Plan-CSW will complete case closure at this time. CSW will notify [place] Management Assistant.  [name], BSW, MSW, [name] Phone: [phone] Fax: [phone]

## 2017-11-13 ENCOUNTER — Encounter: Payer: Self-pay | Admitting: Internal Medicine

## 2017-11-13 DIAGNOSIS — B9689 Other specified bacterial agents as the cause of diseases classified elsewhere: Secondary | ICD-10-CM | POA: Diagnosis not present

## 2017-11-13 DIAGNOSIS — T8451XA Infection and inflammatory reaction due to internal right hip prosthesis, initial encounter: Secondary | ICD-10-CM | POA: Diagnosis not present

## 2017-11-13 DIAGNOSIS — B9620 Unspecified Escherichia coli [E. coli] as the cause of diseases classified elsewhere: Secondary | ICD-10-CM | POA: Diagnosis not present

## 2017-11-15 DIAGNOSIS — T148XXA Other injury of unspecified body region, initial encounter: Secondary | ICD-10-CM | POA: Diagnosis not present

## 2017-11-16 ENCOUNTER — Other Ambulatory Visit: Payer: Self-pay | Admitting: Pharmacist

## 2017-11-22 ENCOUNTER — Encounter: Payer: Self-pay | Admitting: Internal Medicine

## 2017-11-22 ENCOUNTER — Ambulatory Visit (INDEPENDENT_AMBULATORY_CARE_PROVIDER_SITE_OTHER): Payer: PPO | Admitting: Internal Medicine

## 2017-11-22 DIAGNOSIS — T8149XA Infection following a procedure, other surgical site, initial encounter: Secondary | ICD-10-CM

## 2017-11-22 MED ORDER — SULFAMETHOXAZOLE-TRIMETHOPRIM 800-160 MG PO TABS: 1.0000 | ORAL_TABLET | Freq: Two times a day (BID) | ORAL | 2 refills | Status: DC

## 2017-11-22 MED ORDER — CEFTRIAXONE IV (FOR PTA / DISCHARGE USE ONLY): 1.0000 g | INTRAVENOUS | 0 refills | Status: AC

## 2017-11-22 NOTE — Assessment & Plan Note (Signed)
He is doing well on therapy for E. coli postoperative wound infection.  Operative findings suggested that the infection was superficial but I cannot rule out the possibility of deep involvement of his prosthesis.  I discussed treatment options with them today.  He will complete 4 weeks of IV ceftriaxone on 12/03/2017 then started on oral trimethoprim sulfamethoxazole.  I plan on at least 3 months of total antibiotic therapy.  He will follow-up with me in 4 weeks.

## 2017-11-22 NOTE — Progress Notes (Signed)
       [facility]  Patient Active Problem List   Diagnosis Date Noted  . H/O total hip arthroplasty, right 11/02/2017    Priority: High  . Postoperative wound infection 11/02/2017    Priority: High  . Closed right hip fracture, initial encounter (HCC) 10/12/2017    Priority: High  . Postoperative wound infection of right hip 11/02/2017  . Acute blood loss as cause of postoperative anemia 10/17/2017  . Diabetes mellitus type 2 in obese (HCC) 10/12/2017  . Hypothyroidism 10/12/2017  . Essential hypertension 10/12/2017  . HLD (hyperlipidemia) 10/12/2017    Patient's Medications  New Prescriptions   SULFAMETHOXAZOLE-TRIMETHOPRIM (BACTRIM DS,SEPTRA DS) 800-160 MG TABLET    Take 1 tablet by mouth 2 (two) times daily.  Previous Medications   ALBUTEROL (PROVENTIL HFA) 108 (90 BASE) MCG/ACT INHALER    Inhale 2 puffs into the lungs every 6 (six) hours as needed for shortness of breath.   ALPHAGAN P 0.1 % SOLN    Place 1 drop into both eyes daily.   ASPIRIN EC 325 MG TABLET    Take 1 tablet (325 mg total) by mouth daily. To decrease risk of blood clots. Take x 1month post op.   BUDESONIDE-FORMOTEROL (SYMBICORT) 160-4.5 MCG/ACT INHALER    Inhale 2 puffs into the lungs 2 (two) times daily.   DOCUSATE SODIUM (COLACE) 100 MG CAPSULE    Take 1 capsule (100 mg total) by mouth 2 (two) times daily.   FENOFIBRATE (TRICOR) 48 MG TABLET    Take 48 mg by mouth daily.   FINASTERIDE (PROSCAR) 5 MG TABLET    Take 5 mg by mouth daily.   FLAXSEED, LINSEED, (FLAX SEEDS PO)    Take 1 capsule by mouth daily.   GLIPIZIDE (GLUCOTROL XL) 2.5 MG 24 HR TABLET    Take 2.5 mg by mouth daily.   GLUCERNA (GLUCERNA) LIQD    Take 237 mLs by mouth daily.   LATANOPROST (XALATAN) 0.005 % OPHTHALMIC SOLUTION    Place 1 drop into both eyes at bedtime.   LEVOTHYROXINE (SYNTHROID, LEVOTHROID) 75 MCG TABLET    Take 75 mcg by mouth daily.   LISINOPRIL (PRINIVIL,ZESTRIL) 2.5 MG TABLET    Take 2.5 mg  by mouth daily.   MECLIZINE (ANTIVERT) 25 MG TABLET    Take 25 mg by mouth as needed. Travel sickness   METFORMIN (GLUCOPHAGE) 1000 MG TABLET    Take 1,000 mg by mouth 2 (two) times daily.   OMEGA-3 ACID ETHYL ESTERS (LOVAZA) 1 G CAPSULE    Take 1 g by mouth 2 (two) times daily.   ROSUVASTATIN (CRESTOR) 20 MG TABLET    Take 20 mg by mouth daily.   TAMSULOSIN (FLOMAX) 0.4 MG CAPS CAPSULE    Take 0.4 mg by mouth daily.   TIZANIDINE (ZANAFLEX) 2 MG TABLET    Take 1 tablet (2 mg total) by mouth every 8 (eight) hours as needed for muscle spasms.  Modified Medications   Modified Medication Previous Medication   CEFTRIAXONE (ROCEPHIN) IVPB cefTRIAXone (ROCEPHIN) IVPB      Inject 1 g into the vein daily for 11 days. Indication:  Wound infection Last Day of Therapy:  12/03/17 Labs - Once weekly:  CBC/D and BMP, Labs - Every other week:  ESR and CRP    Inject 1 g into the vein daily for 28 days. Indication:  Wound infection Last Day of Therapy:  12/03/17 Labs - Once weekly:  CBC/D and BMP,   Labs - Every other week:  ESR and CRP  Discontinued Medications   No medications on file    Subjective: Nathan Pennington is in for his hospital follow-up visit.  He is an 82-year-old gentleman who sustained a right femoral neck fracture after a fall on 10/11/2017.  He was admitted and underwent right total hip arthroplasty the following day and was discharged to a skilled nursing facility.  About 2 weeks postop he began to develop some wound drainage which has persisted.  He was seen in the office recently and wound drainage was sent for stain and culture. Gram stain showed gram-positive cocci and gram-negative rods.    He underwent incision and debridement and was placed on empiric vancomycin and cefepime.   His right hip culture grew E. coli sensitive to ceftriaxone.  The Gram stain was reported to be showing rare gram-positive cocci but on review no cocci were seen by other observers.  The culture specimen did not say  whether or not it was a superficial or deep specimen.    Dr. John Graves operative note described evidence of superficial infection.  There was a hematoma deep to the fascia but the operative note indicated that there did not appear to be any deep infection.  He has now completed 19 days of IV ceftriaxone.  He is feeling better.  He is having no pain.  He is back at home now.  He is up walking with his walker and doing physical therapy.  He was seen in Dr. Grave's office last week.  Every other staple was removed and the remaining staples are due to be removed this afternoon.  He tells me that about 5 cc of fluid was aspirated from the lower portion of his incision last week after swelling was noted.  He was told that everything was looking better.  Review of Systems: Review of Systems  Constitutional: Negative for chills, diaphoresis and fever.  Gastrointestinal: Negative for abdominal pain, diarrhea, nausea and vomiting.  Musculoskeletal: Negative for joint pain.  Skin: Negative for rash.    Past Medical History:  Diagnosis Date  . Amblyopia   . Asthma   . Cataract   . COPD (chronic obstructive pulmonary disease) (HCC)   . Diabetes mellitus without complication (HCC)    TYPE 2  . Disease of thyroid gland   . Glaucoma   . History of TIA (transient ischemic attack)   . Hyperlipidemia   . Hypertension   . Hypothyroidism   . Hypothyroidism   . Peripheral neuropathy   . Prostate disease   . Retinal arterial branch occlusion, right   . TIA (transient ischemic attack)     Social History   Tobacco Use  . Smoking status: Former Smoker    Types: Cigarettes    Last attempt to quit: 01/21/1976    Years since quitting: 41.8  . Smokeless tobacco: Never Used  Substance Use Topics  . Alcohol use: Yes    Frequency: Never    Comment: occasional  . Drug use: No    Family History  Problem Relation Age of Onset  . Cancer Mother   . Diabetes Mellitus II Father   . Hypertension Father     . Heart disease Father   . Glaucoma Father   . Macular degeneration Neg Hx     Allergies  Allergen Reactions  . Morphine And Related Other (See Comments)    Became very mean and hallucinated    Objective: Vitals:   11/22/17 1127    BP: 98/64  Pulse: 96  Temp: 97.8 F (36.6 C)  TempSrc: Oral  Weight: 220 lb (99.8 kg)   Body mass index is 29.84 kg/m.  Physical Exam  Constitutional: He is oriented to person, place, and time.  He is in good spirits.  He is accompanied by his wife.  Musculoskeletal:  The gauze dressing on his right hip that was placed 2 days ago is clean and dry.  There is no surrounding cellulitis or fluctuance.  Neurological: He is alert and oriented to person, place, and time.  Skin: No rash noted.  Right arm PICC site looks good.  Psychiatric: Mood and affect normal.    Lab Results    Problem List Items Addressed This Visit      High   Postoperative wound infection    He is doing well on therapy for E. coli postoperative wound infection.  Operative findings suggested that the infection was superficial but I cannot rule out the possibility of deep involvement of his prosthesis.  I discussed treatment options with them today.  He will complete 4 weeks of IV ceftriaxone on 12/03/2017 then started on oral trimethoprim sulfamethoxazole.  I plan on at least 3 months of total antibiotic therapy.  He will follow-up with me in 4 weeks.      Relevant Medications   cefTRIAXone (ROCEPHIN) IVPB   sulfamethoxazole-trimethoprim (BACTRIM DS,SEPTRA DS) 800-160 MG tablet (Start on 12/04/2017)       John Campbell, MD [facility] [phone] pager   [phone] cell 11/22/2017, 11:54 AM 

## 2017-11-27 DIAGNOSIS — B9620 Unspecified Escherichia coli [E. coli] as the cause of diseases classified elsewhere: Secondary | ICD-10-CM | POA: Diagnosis not present

## 2017-11-28 ENCOUNTER — Other Ambulatory Visit: Payer: Self-pay | Admitting: Pharmacist

## 2017-12-04 ENCOUNTER — Other Ambulatory Visit: Payer: Self-pay | Admitting: *Deleted

## 2017-12-04 DIAGNOSIS — T8149XA Infection following a procedure, other surgical site, initial encounter: Secondary | ICD-10-CM

## 2017-12-04 DIAGNOSIS — B9620 Unspecified Escherichia coli [E. coli] as the cause of diseases classified elsewhere: Secondary | ICD-10-CM | POA: Diagnosis not present

## 2017-12-04 MED ORDER — SULFAMETHOXAZOLE-TRIMETHOPRIM 800-160 MG PO TABS: 1.0000 | ORAL_TABLET | Freq: Two times a day (BID) | ORAL | 2 refills | Status: DC

## 2017-12-04 NOTE — Telephone Encounter (Signed)
Patient called to correct his pharmacy information. He advised it should have gone to [facility] in [place].

## 2017-12-07 ENCOUNTER — Other Ambulatory Visit: Payer: Self-pay | Admitting: Pharmacist

## 2017-12-11 DIAGNOSIS — T148XXA Other injury of unspecified body region, initial encounter: Secondary | ICD-10-CM | POA: Diagnosis not present

## 2017-12-24 DIAGNOSIS — I10 Essential (primary) hypertension: Secondary | ICD-10-CM | POA: Diagnosis not present

## 2017-12-24 DIAGNOSIS — E119 Type 2 diabetes mellitus without complications: Secondary | ICD-10-CM | POA: Diagnosis not present

## 2017-12-24 DIAGNOSIS — R350 Frequency of micturition: Secondary | ICD-10-CM | POA: Diagnosis not present

## 2017-12-24 DIAGNOSIS — N401 Enlarged prostate with lower urinary tract symptoms: Secondary | ICD-10-CM | POA: Diagnosis not present

## 2017-12-25 ENCOUNTER — Ambulatory Visit: Payer: PPO | Admitting: Internal Medicine

## 2018-01-10 ENCOUNTER — Ambulatory Visit (INDEPENDENT_AMBULATORY_CARE_PROVIDER_SITE_OTHER): Payer: PPO | Admitting: Internal Medicine

## 2018-01-10 DIAGNOSIS — T8149XA Infection following a procedure, other surgical site, initial encounter: Secondary | ICD-10-CM

## 2018-01-10 LAB — BASIC METABOLIC PANEL
BUN: 15 mg/dL (ref 7–25)
CO2: 25 mmol/L (ref 20–32)
CREATININE: 0.97 mg/dL (ref 0.70–1.11)
Calcium: 8.9 mg/dL (ref 8.6–10.3)
Chloride: 106 mmol/L (ref 98–110)
GLUCOSE: 101 mg/dL — AB (ref 65–99)
Potassium: 4.8 mmol/L (ref 3.5–5.3)
Sodium: 136 mmol/L (ref 135–146)

## 2018-01-10 LAB — CBC
HCT: 34.9 % — ABNORMAL LOW (ref 38.5–50.0)
Hemoglobin: 11 g/dL — ABNORMAL LOW (ref 13.2–17.1)
MCH: 27.1 pg (ref 27.0–33.0)
MCHC: 31.5 g/dL — ABNORMAL LOW (ref 32.0–36.0)
MCV: 86 fL (ref 80.0–100.0)
MPV: 10.9 fL (ref 7.5–12.5)
PLATELETS: 315 10*3/uL (ref 140–400)
RBC: 4.06 10*6/uL — AB (ref 4.20–5.80)
RDW: 13.8 % (ref 11.0–15.0)
WBC: 5.7 10*3/uL (ref 3.8–10.8)

## 2018-01-10 LAB — SEDIMENTATION RATE: Sed Rate: 29 mm/h — ABNORMAL HIGH (ref 0–20)

## 2018-01-10 NOTE — Assessment & Plan Note (Addendum)
He is doing very well about 2-1/2 months into therapy for E. coli postoperative wound infection.  It is unclear if the infection involves the hardware in his right hip.  His sed rate is coming down but is still slightly above normal.  I will have him complete about 6 more weeks of trimethoprim sulfamethoxazole.  He is in agreement with that plan.  He will follow-up here in 6 to 8 weeks.

## 2018-01-10 NOTE — Progress Notes (Addendum)
[facility] for Infectious Disease  Patient Active Problem List   Diagnosis Date Noted  . H/O total hip arthroplasty, right 11/02/2017    Priority: High  . Postoperative wound infection 11/02/2017    Priority: High  . Closed right hip fracture, initial encounter ([place]) 10/12/2017    Priority: High  . Postoperative wound infection of right hip 11/02/2017  . Acute blood loss as cause of postoperative anemia 10/17/2017  . Diabetes mellitus type 2 in obese ([place]) 10/12/2017  . Hypothyroidism 10/12/2017  . Essential hypertension 10/12/2017  . HLD (hyperlipidemia) 10/12/2017    Patient's Medications  New Prescriptions   No medications on file  Previous Medications   ALBUTEROL (PROVENTIL HFA) 108 (90 BASE) MCG/ACT INHALER    Inhale 2 puffs into the lungs every 6 (six) hours as needed for shortness of breath.   ALPHAGAN P 0.1 % SOLN    Place 1 drop into both eyes daily.   BUDESONIDE-FORMOTEROL (SYMBICORT) 160-4.5 MCG/ACT INHALER    Inhale 2 puffs into the lungs 2 (two) times daily.   DOCUSATE SODIUM (COLACE) 100 MG CAPSULE    Take 1 capsule (100 mg total) by mouth 2 (two) times daily.   FENOFIBRATE (TRICOR) 48 MG TABLET    Take 48 mg by mouth daily.   FINASTERIDE (PROSCAR) 5 MG TABLET    Take 5 mg by mouth daily.   FLAXSEED, LINSEED, (FLAX SEEDS PO)    Take 1 capsule by mouth daily.   GLIPIZIDE (GLUCOTROL XL) 2.5 MG 24 HR TABLET    Take 2.5 mg by mouth daily.   GLUCERNA (GLUCERNA) LIQD    Take 237 mLs by mouth daily.   LATANOPROST (XALATAN) 0.005 % OPHTHALMIC SOLUTION    Place 1 drop into both eyes at bedtime.   LEVOTHYROXINE (SYNTHROID, LEVOTHROID) 75 MCG TABLET    Take 75 mcg by mouth daily.   LISINOPRIL (PRINIVIL,ZESTRIL) 2.5 MG TABLET    Take 2.5 mg by mouth daily.   MECLIZINE (ANTIVERT) 25 MG TABLET    Take 25 mg by mouth as needed. Travel sickness   METFORMIN (GLUCOPHAGE) 1000 MG TABLET    Take 1,000 mg by mouth 2 (two) times daily.   OMEGA-3 ACID ETHYL ESTERS  (LOVAZA) 1 G CAPSULE    Take 1 g by mouth 2 (two) times daily.   ROSUVASTATIN (CRESTOR) 20 MG TABLET    Take 20 mg by mouth daily.   SULFAMETHOXAZOLE-TRIMETHOPRIM (BACTRIM DS,SEPTRA DS) 800-160 MG TABLET    Take 1 tablet by mouth 2 (two) times daily.   TAMSULOSIN (FLOMAX) 0.4 MG CAPS CAPSULE    Take 0.4 mg by mouth daily.   TIZANIDINE (ZANAFLEX) 2 MG TABLET    Take 1 tablet (2 mg total) by mouth every 8 (eight) hours as needed for muscle spasms.  Modified Medications   No medications on file  Discontinued Medications   No medications on file    Subjective: [name] is in for his hospital follow-up visit.  He is an 82 year old gentleman who sustained a right femoral neck fracture after a fall on 10/11/2017. He was admitted and underwent right total hip arthroplasty the following day and was discharged to a skilled nursing facility. About 2 weeks postop he began to develop some wound drainage which has persisted. He was seen in the office recently and wound drainage was sent for stain and culture. Gram stain showed gram-positive cocci and gram-negative rods.   He underwent incision and debridement  and was placed on empiric vancomycin and cefepime.   His right hip culture grew E. coli sensitive to ceftriaxone. The Gram stain was reported to be showing rare gram-positive cocci but on review no cocci were seen by other observers. The culture specimen did not say whether or not it was a superficial or deep specimen.   Dr. Dorna Leitz operative note described evidence of superficial infection.  There was a hematoma deep to the fascia but the operative note indicated that there did not appear to be any deep infection.  He completed 6 weeks of IV ceftriaxone on 12/03/2017 and then transition to oral trimethoprim sulfamethoxazole.  He has had no problems tolerating his antibiotics.  He is feeling much better.  He tells me that he is about 90% back to normal.  He is not requiring any pain medication.   He has not had any fever, nausea, vomiting or diarrhea.    Review of Systems: Review of Systems  Constitutional: Negative for chills, diaphoresis and fever.  Gastrointestinal: Negative for abdominal pain, diarrhea, nausea and vomiting.  Musculoskeletal: Positive for joint pain.    Past Medical History:  Diagnosis Date  . Amblyopia   . Asthma   . Cataract   . COPD (chronic obstructive pulmonary disease) ([place])   . Diabetes mellitus without complication ([place])    TYPE 2  . Disease of thyroid gland   . Glaucoma   . History of TIA (transient ischemic attack)   . Hyperlipidemia   . Hypertension   . Hypothyroidism   . Hypothyroidism   . Peripheral neuropathy   . Prostate disease   . Retinal arterial branch occlusion, right   . TIA (transient ischemic attack)     Social History   Tobacco Use  . Smoking status: Former Smoker    Types: Cigarettes    Last attempt to quit: 01/21/1976    Years since quitting: 42.0  . Smokeless tobacco: Never Used  Substance Use Topics  . Alcohol use: Yes    Frequency: Never    Comment: occasional  . Drug use: No    Family History  Problem Relation Age of Onset  . Cancer Mother   . Diabetes Mellitus II Father   . Hypertension Father   . Heart disease Father   . Glaucoma Father   . Macular degeneration Neg Hx     Allergies  Allergen Reactions  . Morphine And Related Other (See Comments)    Became very mean and hallucinated    Objective: Vitals:   01/10/18 0855  BP: 112/69  Pulse: 88  Temp: 97.6 F (36.4 C)  TempSrc: Oral  Weight: 213 lb (96.6 kg)  Height: 6' (1.829 m)   Body mass index is 28.89 kg/m.  Physical Exam  Constitutional: He is oriented to person, place, and time.  He is in good spirits.  Musculoskeletal:  His anterior right hip incision is fully healed without evidence of active infection.  Neurological: He is alert and oriented to person, place, and time.  Skin: No rash noted.  Psychiatric: He has a  normal mood and affect.    Lab Results Lab Results  Component Value Date   WBC 5.7 01/10/2018   HGB 11.0 (L) 01/10/2018   HCT 34.9 (L) 01/10/2018   MCV 86.0 01/10/2018   PLT 315 01/10/2018   BMET    Component Value Date/Time   NA 136 01/10/2018 0915   NA 140 10/19/2017   K 4.8 01/10/2018 0915   CL 106  01/10/2018 0915   CO2 25 01/10/2018 0915   GLUCOSE 101 (H) 01/10/2018 0915   BUN 15 01/10/2018 0915   BUN 17 10/19/2017   CREATININE 0.97 01/10/2018 0915   CALCIUM 8.9 01/10/2018 0915   GFRNONAA >60 11/05/2017 0358   GFRAA >60 11/05/2017 0358   BMET    Component Value Date/Time   NA 136 01/10/2018 0915   NA 140 10/19/2017   K 4.8 01/10/2018 0915   CL 106 01/10/2018 0915   CO2 25 01/10/2018 0915   GLUCOSE 101 (H) 01/10/2018 0915   BUN 15 01/10/2018 0915   BUN 17 10/19/2017   CREATININE 0.97 01/10/2018 0915   CALCIUM 8.9 01/10/2018 0915   GFRNONAA >60 11/05/2017 0358   GFRAA >60 11/05/2017 0358   Sed Rate  Date Value  01/10/2018 29 mm/h (H)  11/02/2017 61 mm/hr (H)   CRP (mg/dL)  Date Value  11/02/2017 <0.8      Problem List Items Addressed This Visit      High   Postoperative wound infection    He is doing very well about 2-1/2 months into therapy for E. coli postoperative wound infection.  It is unclear if the infection involves the hardware in his right hip.  His sed rate is coming down but is still slightly above normal.  I will have him complete about 6 more weeks of trimethoprim sulfamethoxazole.  He is in agreement with that plan.  He will follow-up here in 6 to 8 weeks.      Relevant Orders   CBC (Completed)   Basic metabolic panel (Completed)   Sedimentation rate (Completed)       Michel Bickers, MD [facility] for [place] [phone] pager   [phone] cell 01/11/2018, 10:53 AM

## 2018-01-11 ENCOUNTER — Other Ambulatory Visit: Payer: Self-pay | Admitting: Internal Medicine

## 2018-01-11 DIAGNOSIS — T8149XA Infection following a procedure, other surgical site, initial encounter: Secondary | ICD-10-CM

## 2018-01-11 MED ORDER — SULFAMETHOXAZOLE-TRIMETHOPRIM 800-160 MG PO TABS: 1.0000 | ORAL_TABLET | Freq: Two times a day (BID) | ORAL | 2 refills | Status: DC

## 2018-01-22 DIAGNOSIS — H401132 Primary open-angle glaucoma, bilateral, moderate stage: Secondary | ICD-10-CM | POA: Diagnosis not present

## 2018-01-28 DIAGNOSIS — N401 Enlarged prostate with lower urinary tract symptoms: Secondary | ICD-10-CM | POA: Diagnosis not present

## 2018-01-28 DIAGNOSIS — R350 Frequency of micturition: Secondary | ICD-10-CM | POA: Diagnosis not present

## 2018-02-14 DIAGNOSIS — T148XXA Other injury of unspecified body region, initial encounter: Secondary | ICD-10-CM | POA: Diagnosis not present

## 2018-03-26 ENCOUNTER — Encounter: Payer: Self-pay | Admitting: Internal Medicine

## 2018-03-26 ENCOUNTER — Ambulatory Visit (INDEPENDENT_AMBULATORY_CARE_PROVIDER_SITE_OTHER): Payer: PPO | Admitting: Internal Medicine

## 2018-03-26 DIAGNOSIS — Z9189 Other specified personal risk factors, not elsewhere classified: Secondary | ICD-10-CM | POA: Diagnosis not present

## 2018-03-26 DIAGNOSIS — T8149XA Infection following a procedure, other surgical site, initial encounter: Secondary | ICD-10-CM | POA: Diagnosis not present

## 2018-03-26 DIAGNOSIS — E785 Hyperlipidemia, unspecified: Secondary | ICD-10-CM | POA: Diagnosis not present

## 2018-03-26 DIAGNOSIS — L84 Corns and callosities: Secondary | ICD-10-CM | POA: Diagnosis not present

## 2018-03-26 DIAGNOSIS — D62 Acute posthemorrhagic anemia: Secondary | ICD-10-CM | POA: Diagnosis not present

## 2018-03-26 DIAGNOSIS — D692 Other nonthrombocytopenic purpura: Secondary | ICD-10-CM | POA: Diagnosis not present

## 2018-03-26 DIAGNOSIS — E119 Type 2 diabetes mellitus without complications: Secondary | ICD-10-CM | POA: Diagnosis not present

## 2018-03-26 DIAGNOSIS — E039 Hypothyroidism, unspecified: Secondary | ICD-10-CM | POA: Diagnosis not present

## 2018-03-26 MED ORDER — SULFAMETHOXAZOLE-TRIMETHOPRIM 800-160 MG PO TABS: 1.0000 | ORAL_TABLET | Freq: Two times a day (BID) | ORAL | 2 refills | Status: AC

## 2018-03-26 NOTE — Progress Notes (Signed)
[facility] for Infectious Disease  Patient Active Problem List   Diagnosis Date Noted  . H/O total hip arthroplasty, right 11/02/2017    Priority: High  . Postoperative wound infection 11/02/2017    Priority: High  . Closed right hip fracture, initial encounter ([place]) 10/12/2017    Priority: High  . Postoperative wound infection of right hip 11/02/2017  . Acute blood loss as cause of postoperative anemia 10/17/2017  . Diabetes mellitus type 2 in obese ([place]) 10/12/2017  . Hypothyroidism 10/12/2017  . Essential hypertension 10/12/2017  . HLD (hyperlipidemia) 10/12/2017    Patient's Medications  New Prescriptions   No medications on file  Previous Medications   ALBUTEROL (PROVENTIL HFA) 108 (90 BASE) MCG/ACT INHALER    Inhale 2 puffs into the lungs every 6 (six) hours as needed for shortness of breath.   ALPHAGAN P 0.1 % SOLN    Place 1 drop into both eyes daily.   BUDESONIDE-FORMOTEROL (SYMBICORT) 160-4.5 MCG/ACT INHALER    Inhale 2 puffs into the lungs 2 (two) times daily.   DOCUSATE SODIUM (COLACE) 100 MG CAPSULE    Take 1 capsule (100 mg total) by mouth 2 (two) times daily.   FENOFIBRATE (TRICOR) 48 MG TABLET    Take 48 mg by mouth daily.   FINASTERIDE (PROSCAR) 5 MG TABLET    Take 5 mg by mouth daily.   FLAXSEED, LINSEED, (FLAX SEEDS PO)    Take 1 capsule by mouth daily.   GLIPIZIDE (GLUCOTROL XL) 2.5 MG 24 HR TABLET    Take 2.5 mg by mouth daily.   GLUCERNA (GLUCERNA) LIQD    Take 237 mLs by mouth daily.   LATANOPROST (XALATAN) 0.005 % OPHTHALMIC SOLUTION    Place 1 drop into both eyes at bedtime.   LEVOTHYROXINE (SYNTHROID, LEVOTHROID) 75 MCG TABLET    Take 75 mcg by mouth daily.   LISINOPRIL (PRINIVIL,ZESTRIL) 2.5 MG TABLET    Take 2.5 mg by mouth daily.   MECLIZINE (ANTIVERT) 25 MG TABLET    Take 25 mg by mouth as needed. Travel sickness   METFORMIN (GLUCOPHAGE) 1000 MG TABLET    Take 1,000 mg by mouth 2 (two) times daily.   OMEGA-3 ACID ETHYL ESTERS  (LOVAZA) 1 G CAPSULE    Take 1 g by mouth 2 (two) times daily.   ROSUVASTATIN (CRESTOR) 20 MG TABLET    Take 20 mg by mouth daily.   TAMSULOSIN (FLOMAX) 0.4 MG CAPS CAPSULE    Take 0.4 mg by mouth daily.   TIZANIDINE (ZANAFLEX) 2 MG TABLET    Take 1 tablet (2 mg total) by mouth every 8 (eight) hours as needed for muscle spasms.  Modified Medications   Modified Medication Previous Medication   SULFAMETHOXAZOLE-TRIMETHOPRIM (BACTRIM DS,SEPTRA DS) 800-160 MG TABLET sulfamethoxazole-trimethoprim (BACTRIM DS,SEPTRA DS) 800-160 MG tablet      Take 1 tablet by mouth 2 (two) times daily for 7 days.    Take 1 tablet by mouth 2 (two) times daily.  Discontinued Medications   No medications on file    Subjective: Nathan Pennington is in for his hospital follow-up visit.  He is an 82 year old gentleman who sustained a right femoral neck fracture after a fall on 10/11/2017. He was admitted and underwent right total hip arthroplasty the following day and was discharged to a skilled nursing facility. About 2 weeks postop he began to develop some wound drainage which has persisted. He was seen in the office recently  and wound drainage was sent for stain and culture. Gram stain showed gram-positive cocci and gram-negative rods.   He underwent incision and debridement and was placed on empiric vancomycin and cefepime.   His right hip culture grew E. coli sensitive to ceftriaxone. The Gram stain was reported to be showing rare gram-positive cocci but on review no cocci were seen by other observers. The culture specimen did not say whether or not it was a superficial or deep specimen.   Dr. Dorna Leitz operative note described evidence of superficial infection.  There was a hematoma deep to the fascia but the operative note indicated that there did not appear to be any deep infection.  He completed 6 weeks of IV ceftriaxone on 12/03/2017 and then transition to oral trimethoprim sulfamethoxazole.  He has had no problems  tolerating his antibiotics.  He is feeling much better.  He is not requiring any pain medication.  He has not had any fever, nausea, vomiting or diarrhea.    Review of Systems: Review of Systems  Constitutional: Negative for chills, diaphoresis and fever.  Gastrointestinal: Negative for abdominal pain, diarrhea, nausea and vomiting.  Musculoskeletal: Positive for joint pain.    Past Medical History:  Diagnosis Date  . Amblyopia   . Asthma   . Cataract   . COPD (chronic obstructive pulmonary disease) ([place])   . Diabetes mellitus without complication ([place])    TYPE 2  . Disease of thyroid gland   . Glaucoma   . History of TIA (transient ischemic attack)   . Hyperlipidemia   . Hypertension   . Hypothyroidism   . Hypothyroidism   . Peripheral neuropathy   . Prostate disease   . Retinal arterial branch occlusion, right   . TIA (transient ischemic attack)     Social History   Tobacco Use  . Smoking status: Former Smoker    Types: Cigarettes    Last attempt to quit: 01/21/1976    Years since quitting: 42.2  . Smokeless tobacco: Never Used  Substance Use Topics  . Alcohol use: Yes    Frequency: Never    Comment: occasional  . Drug use: No    Family History  Problem Relation Age of Onset  . Cancer Mother   . Diabetes Mellitus II Father   . Hypertension Father   . Heart disease Father   . Glaucoma Father   . Macular degeneration Neg Hx     Allergies  Allergen Reactions  . Morphine And Related Other (See Comments)    Became very mean and hallucinated    Objective: Vitals:   03/26/18 1120  BP: 101/64  Pulse: 81  Temp: (!) 97.4 F (36.3 C)  TempSrc: Oral  Weight: 216 lb (98 kg)  Height: 6' (1.829 m)   Body mass index is 29.29 kg/m.  Physical Exam  Constitutional: He is oriented to person, place, and time.  He is very pleasant and in good spirits.  Musculoskeletal:  His right hip incision is fully healed without evidence of active infection.    Neurological: He is alert and oriented to person, place, and time.  Skin: No rash noted.  Psychiatric: He has a normal mood and affect.    Lab Results Lab Results  Component Value Date   WBC 5.7 01/10/2018   HGB 11.0 (L) 01/10/2018   HCT 34.9 (L) 01/10/2018   MCV 86.0 01/10/2018   PLT 315 01/10/2018   BMET    Component Value Date/Time   NA 136 01/10/2018  0915   NA 140 10/19/2017   K 4.8 01/10/2018 0915   CL 106 01/10/2018 0915   CO2 25 01/10/2018 0915   GLUCOSE 101 (H) 01/10/2018 0915   BUN 15 01/10/2018 0915   BUN 17 10/19/2017   CREATININE 0.97 01/10/2018 0915   CALCIUM 8.9 01/10/2018 0915   GFRNONAA >60 11/05/2017 0358   GFRAA >60 11/05/2017 0358   BMET    Component Value Date/Time   NA 136 01/10/2018 0915   NA 140 10/19/2017   K 4.8 01/10/2018 0915   CL 106 01/10/2018 0915   CO2 25 01/10/2018 0915   GLUCOSE 101 (H) 01/10/2018 0915   BUN 15 01/10/2018 0915   BUN 17 10/19/2017   CREATININE 0.97 01/10/2018 0915   CALCIUM 8.9 01/10/2018 0915   GFRNONAA >60 11/05/2017 0358   GFRAA >60 11/05/2017 0358   Sed Rate  Date Value  01/10/2018 29 mm/h (H)  11/02/2017 61 mm/hr (H)   CRP (mg/dL)  Date Value  11/02/2017 <0.8      Problem List Items Addressed This Visit      High   Postoperative wound infection    I think that there is a very good chance that he has wound infection is now cured.  He agrees with stopping trimethoprim sulfamethoxazole when he runs out next week.  He will follow-up here in about 6 weeks.      Relevant Medications   sulfamethoxazole-trimethoprim (BACTRIM DS,SEPTRA DS) 800-160 MG tablet       Michel Bickers, MD [facility] for Infectious [place] [phone] pager   [phone] cell 03/26/2018, 11:33 AM

## 2018-03-26 NOTE — Assessment & Plan Note (Signed)
I think that there is a very good chance that he has wound infection is now cured.  He agrees with stopping trimethoprim sulfamethoxazole when he runs out next week.  He will follow-up here in about 6 weeks.

## 2018-03-28 DIAGNOSIS — D62 Acute posthemorrhagic anemia: Secondary | ICD-10-CM | POA: Diagnosis not present

## 2018-03-28 DIAGNOSIS — D692 Other nonthrombocytopenic purpura: Secondary | ICD-10-CM | POA: Diagnosis not present

## 2018-04-04 DIAGNOSIS — D509 Iron deficiency anemia, unspecified: Secondary | ICD-10-CM | POA: Diagnosis not present

## 2018-04-09 DIAGNOSIS — L84 Corns and callosities: Secondary | ICD-10-CM | POA: Diagnosis not present

## 2018-04-09 DIAGNOSIS — E119 Type 2 diabetes mellitus without complications: Secondary | ICD-10-CM | POA: Diagnosis not present

## 2018-05-07 DIAGNOSIS — H401132 Primary open-angle glaucoma, bilateral, moderate stage: Secondary | ICD-10-CM | POA: Diagnosis not present

## 2018-05-07 DIAGNOSIS — T50905A Adverse effect of unspecified drugs, medicaments and biological substances, initial encounter: Secondary | ICD-10-CM | POA: Diagnosis not present

## 2018-05-15 ENCOUNTER — Ambulatory Visit: Payer: PPO | Admitting: Internal Medicine

## 2018-05-17 DIAGNOSIS — M25551 Pain in right hip: Secondary | ICD-10-CM | POA: Diagnosis not present

## 2018-05-17 DIAGNOSIS — Z96641 Presence of right artificial hip joint: Secondary | ICD-10-CM | POA: Diagnosis not present

## 2018-05-20 DIAGNOSIS — N401 Enlarged prostate with lower urinary tract symptoms: Secondary | ICD-10-CM | POA: Diagnosis not present

## 2018-05-20 DIAGNOSIS — R350 Frequency of micturition: Secondary | ICD-10-CM | POA: Diagnosis not present

## 2018-05-20 DIAGNOSIS — N138 Other obstructive and reflux uropathy: Secondary | ICD-10-CM | POA: Diagnosis not present

## 2018-05-20 DIAGNOSIS — R351 Nocturia: Secondary | ICD-10-CM | POA: Diagnosis not present

## 2018-05-22 DIAGNOSIS — D649 Anemia, unspecified: Secondary | ICD-10-CM | POA: Diagnosis not present

## 2018-06-13 ENCOUNTER — Encounter: Payer: Self-pay | Admitting: Internal Medicine

## 2018-06-13 ENCOUNTER — Ambulatory Visit (INDEPENDENT_AMBULATORY_CARE_PROVIDER_SITE_OTHER): Payer: PPO | Admitting: Internal Medicine

## 2018-06-13 DIAGNOSIS — T8149XA Infection following a procedure, other surgical site, initial encounter: Secondary | ICD-10-CM | POA: Diagnosis not present

## 2018-06-13 NOTE — Progress Notes (Signed)
[facility] for Infectious Disease  Patient Active Problem List   Diagnosis Date Noted  . H/O total hip arthroplasty, right 11/02/2017    Priority: High  . Postoperative wound infection 11/02/2017    Priority: High  . Closed right hip fracture, initial encounter ([place]) 10/12/2017    Priority: High  . Postoperative wound infection of right hip 11/02/2017  . Acute blood loss as cause of postoperative anemia 10/17/2017  . Diabetes mellitus type 2 in obese ([place]) 10/12/2017  . Hypothyroidism 10/12/2017  . Essential hypertension 10/12/2017  . HLD (hyperlipidemia) 10/12/2017    Patient's Medications  New Prescriptions   No medications on file  Previous Medications   ALBUTEROL (PROVENTIL HFA) 108 (90 BASE) MCG/ACT INHALER    Inhale 2 puffs into the lungs every 6 (six) hours as needed for shortness of breath.   ALPHAGAN P 0.1 % SOLN    Place 1 drop into both eyes daily.   ASPIRIN EC 81 MG TABLET    Take 1 tablet by mouth daily.   BUDESONIDE-FORMOTEROL (SYMBICORT) 160-4.5 MCG/ACT INHALER    Inhale 2 puffs into the lungs 2 (two) times daily.   DOCUSATE SODIUM (COLACE) 100 MG CAPSULE    Take 1 capsule (100 mg total) by mouth 2 (two) times daily.   DORZOLAMIDE (TRUSOPT) 2 % OPHTHALMIC SOLUTION    Place 1 drop into both eyes 2 (two) times daily.   FENOFIBRATE (TRICOR) 48 MG TABLET    Take 48 mg by mouth daily.   FERREX 150 150 MG CAPSULE    Take 1 capsule by mouth 2 (two) times daily.   FINASTERIDE (PROSCAR) 5 MG TABLET    Take 5 mg by mouth daily.   FLAXSEED, LINSEED, (FLAX SEEDS PO)    Take 1 capsule by mouth daily.   GLIPIZIDE (GLUCOTROL XL) 2.5 MG 24 HR TABLET    Take 2.5 mg by mouth daily.   GLUCERNA (GLUCERNA) LIQD    Take 237 mLs by mouth daily.   LATANOPROST (XALATAN) 0.005 % OPHTHALMIC SOLUTION    Place 1 drop into both eyes at bedtime.   LEVOTHYROXINE (SYNTHROID, LEVOTHROID) 75 MCG TABLET    Take 75 mcg by mouth daily.   LISINOPRIL (PRINIVIL,ZESTRIL) 2.5 MG TABLET     Take 2.5 mg by mouth daily.   MECLIZINE (ANTIVERT) 25 MG TABLET    Take 25 mg by mouth as needed. Travel sickness   METFORMIN (GLUCOPHAGE) 1000 MG TABLET    Take 1,000 mg by mouth 2 (two) times daily.   OMEGA-3 ACID ETHYL ESTERS (LOVAZA) 1 G CAPSULE    Take 1 g by mouth 2 (two) times daily.   ROSUVASTATIN (CRESTOR) 20 MG TABLET    Take 20 mg by mouth daily.   TAMSULOSIN (FLOMAX) 0.4 MG CAPS CAPSULE    Take 0.4 mg by mouth daily.   TIZANIDINE (ZANAFLEX) 2 MG TABLET    Take 1 tablet (2 mg total) by mouth every 8 (eight) hours as needed for muscle spasms.  Modified Medications   No medications on file  Discontinued Medications   LATANOPROST (XALATAN) 0.005 % OPHTHALMIC SOLUTION    Place 1 drop into both eyes at bedtime.    Subjective: Nathan Pennington is in for his routine follow-up visit.  He is an 82 year old gentleman who sustained a right femoral neck fracture after a fall on 10/11/2017. He was admitted and underwent right total hip arthroplasty the following day and was discharged to a  skilled nursing facility. About 2 weeks postop he began to develop some wound drainage which has persisted. He was seen in the office recently and wound drainage was sent for stain and culture. Gram stain showed gram-positive cocci and gram-negative rods.   He underwent incision and debridement and was placed on empiric vancomycin and cefepime.   His right hip culture grew E. coli sensitive to ceftriaxone. The Gram stain was reported to be showing rare gram-positive cocci but on review no cocci were seen by other observers. The culture specimen did not say whether or not it was a superficial or deep specimen.   Dr. Dorna Leitz operative note described evidence of superficial infection.  There was a hematoma deep to the fascia but the operative note indicated that there did not appear to be any deep infection.  He completed 6 weeks of IV ceftriaxone on 12/03/2017 and then transitioned to oral trimethoprim  sulfamethoxazole which she completed around 03/30/2018.  He had no problems tolerating his antibiotics.  He is feeling much better.  He is not requiring any pain medication.  He has not had any fever, nausea, vomiting or diarrhea.    Review of Systems: Review of Systems  Constitutional: Negative for chills, diaphoresis and fever.  Gastrointestinal: Negative for abdominal pain, diarrhea, nausea and vomiting.  Musculoskeletal: Positive for joint pain.    Past Medical History:  Diagnosis Date  . Amblyopia   . Asthma   . Cataract   . COPD (chronic obstructive pulmonary disease) ([place])   . Diabetes mellitus without complication ([place])    TYPE 2  . Disease of thyroid gland   . Glaucoma   . History of TIA (transient ischemic attack)   . Hyperlipidemia   . Hypertension   . Hypothyroidism   . Hypothyroidism   . Peripheral neuropathy   . Prostate disease   . Retinal arterial branch occlusion, right   . TIA (transient ischemic attack)     Social History   Tobacco Use  . Smoking status: Former Smoker    Types: Cigarettes    Last attempt to quit: 01/21/1976    Years since quitting: 42.4  . Smokeless tobacco: Never Used  Substance Use Topics  . Alcohol use: Yes    Frequency: Never    Comment: occasional  . Drug use: No    Family History  Problem Relation Age of Onset  . Cancer Mother   . Diabetes Mellitus II Father   . Hypertension Father   . Heart disease Father   . Glaucoma Father   . Macular degeneration Neg Hx     Allergies  Allergen Reactions  . Brimonidine Other (See Comments)    Swollen, red eyes.   . Morphine And Related Other (See Comments)    Became very mean and hallucinated    Objective: Vitals:   06/13/18 1532  BP: 105/70  Pulse: 83  Temp: 97.6 F (36.4 C)  Weight: 222 lb (100.7 kg)  Height: 6' (1.829 m)   Body mass index is 30.11 kg/m.  Physical Exam  Constitutional: He is oriented to person, place, and time.  He is very pleasant and in good  spirits.  Musculoskeletal:  His right hip incision is fully healed without evidence of active infection.  Neurological: He is alert and oriented to person, place, and time.  Skin: No rash noted.  Psychiatric: He has a normal mood and affect.    Lab Results Lab Results  Component Value Date   WBC 5.7 01/10/2018  HGB 11.0 (L) 01/10/2018   HCT 34.9 (L) 01/10/2018   MCV 86.0 01/10/2018   PLT 315 01/10/2018   BMET    Component Value Date/Time   NA 136 01/10/2018 0915   NA 140 10/19/2017   K 4.8 01/10/2018 0915   CL 106 01/10/2018 0915   CO2 25 01/10/2018 0915   GLUCOSE 101 (H) 01/10/2018 0915   BUN 15 01/10/2018 0915   BUN 17 10/19/2017   CREATININE 0.97 01/10/2018 0915   CALCIUM 8.9 01/10/2018 0915   GFRNONAA >60 11/05/2017 0358   GFRAA >60 11/05/2017 0358   BMET    Component Value Date/Time   NA 136 01/10/2018 0915   NA 140 10/19/2017   K 4.8 01/10/2018 0915   CL 106 01/10/2018 0915   CO2 25 01/10/2018 0915   GLUCOSE 101 (H) 01/10/2018 0915   BUN 15 01/10/2018 0915   BUN 17 10/19/2017   CREATININE 0.97 01/10/2018 0915   CALCIUM 8.9 01/10/2018 0915   GFRNONAA >60 11/05/2017 0358   GFRAA >60 11/05/2017 0358   Sed Rate  Date Value  01/10/2018 29 mm/h (H)  11/02/2017 61 mm/hr (H)   CRP (mg/dL)  Date Value  11/02/2017 <0.8      Problem List Items Addressed This Visit      High   Postoperative wound infection    It appears that his infection has been cured.  He can follow-up here as needed.          Michel Bickers, MD [facility] for [facility] [phone] pager   [phone] cell 06/13/2018, 3:58 PM

## 2018-06-13 NOTE — Assessment & Plan Note (Signed)
It appears that his infection has been cured.  He can follow-up here as needed.

## 2018-06-17 DIAGNOSIS — E119 Type 2 diabetes mellitus without complications: Secondary | ICD-10-CM | POA: Diagnosis not present

## 2018-06-17 DIAGNOSIS — E039 Hypothyroidism, unspecified: Secondary | ICD-10-CM | POA: Diagnosis not present

## 2018-06-17 DIAGNOSIS — E785 Hyperlipidemia, unspecified: Secondary | ICD-10-CM | POA: Diagnosis not present

## 2018-06-17 DIAGNOSIS — D509 Iron deficiency anemia, unspecified: Secondary | ICD-10-CM | POA: Diagnosis not present

## 2018-06-17 DIAGNOSIS — Z23 Encounter for immunization: Secondary | ICD-10-CM | POA: Diagnosis not present

## 2018-06-18 DIAGNOSIS — H401132 Primary open-angle glaucoma, bilateral, moderate stage: Secondary | ICD-10-CM | POA: Diagnosis not present

## 2018-07-09 DIAGNOSIS — D649 Anemia, unspecified: Secondary | ICD-10-CM | POA: Diagnosis not present

## 2018-07-09 DIAGNOSIS — D123 Benign neoplasm of transverse colon: Secondary | ICD-10-CM | POA: Diagnosis not present

## 2018-07-09 DIAGNOSIS — D126 Benign neoplasm of colon, unspecified: Secondary | ICD-10-CM | POA: Diagnosis not present

## 2018-07-09 DIAGNOSIS — K5730 Diverticulosis of large intestine without perforation or abscess without bleeding: Secondary | ICD-10-CM | POA: Diagnosis not present

## 2018-07-09 DIAGNOSIS — Z86010 Personal history of colonic polyps: Secondary | ICD-10-CM | POA: Diagnosis not present

## 2018-07-09 DIAGNOSIS — Z1211 Encounter for screening for malignant neoplasm of colon: Secondary | ICD-10-CM | POA: Diagnosis not present

## 2018-07-09 DIAGNOSIS — D128 Benign neoplasm of rectum: Secondary | ICD-10-CM | POA: Diagnosis not present

## 2018-07-09 DIAGNOSIS — D122 Benign neoplasm of ascending colon: Secondary | ICD-10-CM | POA: Diagnosis not present

## 2018-07-09 DIAGNOSIS — K641 Second degree hemorrhoids: Secondary | ICD-10-CM | POA: Diagnosis not present

## 2018-07-10 DIAGNOSIS — D509 Iron deficiency anemia, unspecified: Secondary | ICD-10-CM | POA: Diagnosis not present

## 2018-08-01 DIAGNOSIS — Z7984 Long term (current) use of oral hypoglycemic drugs: Secondary | ICD-10-CM | POA: Diagnosis not present

## 2018-08-01 DIAGNOSIS — Z83511 Family history of glaucoma: Secondary | ICD-10-CM | POA: Diagnosis not present

## 2018-08-01 DIAGNOSIS — H25012 Cortical age-related cataract, left eye: Secondary | ICD-10-CM | POA: Diagnosis not present

## 2018-08-01 DIAGNOSIS — J449 Chronic obstructive pulmonary disease, unspecified: Secondary | ICD-10-CM | POA: Diagnosis not present

## 2018-08-01 DIAGNOSIS — H52203 Unspecified astigmatism, bilateral: Secondary | ICD-10-CM | POA: Diagnosis not present

## 2018-08-01 DIAGNOSIS — H401122 Primary open-angle glaucoma, left eye, moderate stage: Secondary | ICD-10-CM | POA: Diagnosis not present

## 2018-08-01 DIAGNOSIS — Z8673 Personal history of transient ischemic attack (TIA), and cerebral infarction without residual deficits: Secondary | ICD-10-CM | POA: Diagnosis not present

## 2018-08-01 DIAGNOSIS — H401113 Primary open-angle glaucoma, right eye, severe stage: Secondary | ICD-10-CM | POA: Diagnosis not present

## 2018-08-01 DIAGNOSIS — H5203 Hypermetropia, bilateral: Secondary | ICD-10-CM | POA: Diagnosis not present

## 2018-08-01 DIAGNOSIS — E119 Type 2 diabetes mellitus without complications: Secondary | ICD-10-CM | POA: Diagnosis not present

## 2018-08-01 DIAGNOSIS — H2513 Age-related nuclear cataract, bilateral: Secondary | ICD-10-CM | POA: Diagnosis not present

## 2018-08-01 DIAGNOSIS — H524 Presbyopia: Secondary | ICD-10-CM | POA: Diagnosis not present

## 2018-08-12 DIAGNOSIS — D509 Iron deficiency anemia, unspecified: Secondary | ICD-10-CM | POA: Diagnosis not present

## 2018-08-16 DIAGNOSIS — D509 Iron deficiency anemia, unspecified: Secondary | ICD-10-CM | POA: Diagnosis not present

## 2018-08-20 DIAGNOSIS — H9190 Unspecified hearing loss, unspecified ear: Secondary | ICD-10-CM | POA: Diagnosis not present

## 2018-08-20 DIAGNOSIS — E785 Hyperlipidemia, unspecified: Secondary | ICD-10-CM | POA: Diagnosis not present

## 2018-08-20 DIAGNOSIS — E1169 Type 2 diabetes mellitus with other specified complication: Secondary | ICD-10-CM | POA: Diagnosis not present

## 2018-08-23 DIAGNOSIS — H903 Sensorineural hearing loss, bilateral: Secondary | ICD-10-CM | POA: Diagnosis not present

## 2018-08-23 DIAGNOSIS — D509 Iron deficiency anemia, unspecified: Secondary | ICD-10-CM | POA: Diagnosis not present

## 2018-11-22 DIAGNOSIS — E1169 Type 2 diabetes mellitus with other specified complication: Secondary | ICD-10-CM | POA: Diagnosis not present

## 2018-11-22 DIAGNOSIS — E039 Hypothyroidism, unspecified: Secondary | ICD-10-CM | POA: Diagnosis not present

## 2018-11-22 DIAGNOSIS — E785 Hyperlipidemia, unspecified: Secondary | ICD-10-CM | POA: Diagnosis not present

## 2018-11-22 DIAGNOSIS — E119 Type 2 diabetes mellitus without complications: Secondary | ICD-10-CM | POA: Diagnosis not present

## 2018-11-25 DIAGNOSIS — H903 Sensorineural hearing loss, bilateral: Secondary | ICD-10-CM | POA: Diagnosis not present

## 2019-01-21 DIAGNOSIS — H401122 Primary open-angle glaucoma, left eye, moderate stage: Secondary | ICD-10-CM | POA: Diagnosis not present

## 2019-01-21 DIAGNOSIS — H401113 Primary open-angle glaucoma, right eye, severe stage: Secondary | ICD-10-CM | POA: Diagnosis not present

## 2019-03-17 DIAGNOSIS — E119 Type 2 diabetes mellitus without complications: Secondary | ICD-10-CM | POA: Diagnosis not present

## 2019-03-17 DIAGNOSIS — E1169 Type 2 diabetes mellitus with other specified complication: Secondary | ICD-10-CM | POA: Diagnosis not present

## 2019-03-17 DIAGNOSIS — E785 Hyperlipidemia, unspecified: Secondary | ICD-10-CM | POA: Diagnosis not present

## 2019-03-17 DIAGNOSIS — E039 Hypothyroidism, unspecified: Secondary | ICD-10-CM | POA: Diagnosis not present

## 2019-03-17 DIAGNOSIS — D692 Other nonthrombocytopenic purpura: Secondary | ICD-10-CM | POA: Diagnosis not present

## 2019-04-17 DIAGNOSIS — M1612 Unilateral primary osteoarthritis, left hip: Secondary | ICD-10-CM | POA: Diagnosis not present

## 2019-04-17 DIAGNOSIS — Z7984 Long term (current) use of oral hypoglycemic drugs: Secondary | ICD-10-CM | POA: Diagnosis not present

## 2019-04-17 DIAGNOSIS — S79912A Unspecified injury of left hip, initial encounter: Secondary | ICD-10-CM | POA: Diagnosis not present

## 2019-04-17 DIAGNOSIS — M25552 Pain in left hip: Secondary | ICD-10-CM | POA: Diagnosis not present

## 2019-04-17 DIAGNOSIS — M25551 Pain in right hip: Secondary | ICD-10-CM | POA: Diagnosis not present

## 2019-04-17 DIAGNOSIS — E119 Type 2 diabetes mellitus without complications: Secondary | ICD-10-CM | POA: Diagnosis not present

## 2019-04-17 DIAGNOSIS — S79911A Unspecified injury of right hip, initial encounter: Secondary | ICD-10-CM | POA: Diagnosis not present

## 2019-04-17 DIAGNOSIS — R531 Weakness: Secondary | ICD-10-CM | POA: Diagnosis not present

## 2019-04-17 DIAGNOSIS — Z96641 Presence of right artificial hip joint: Secondary | ICD-10-CM | POA: Diagnosis not present

## 2019-05-01 DIAGNOSIS — M1612 Unilateral primary osteoarthritis, left hip: Secondary | ICD-10-CM | POA: Diagnosis not present

## 2019-05-01 DIAGNOSIS — Z9181 History of falling: Secondary | ICD-10-CM | POA: Diagnosis not present

## 2019-05-01 DIAGNOSIS — E119 Type 2 diabetes mellitus without complications: Secondary | ICD-10-CM | POA: Diagnosis not present

## 2019-05-01 DIAGNOSIS — R531 Weakness: Secondary | ICD-10-CM | POA: Diagnosis not present

## 2019-05-01 DIAGNOSIS — E559 Vitamin D deficiency, unspecified: Secondary | ICD-10-CM | POA: Diagnosis not present

## 2019-05-16 DIAGNOSIS — E039 Hypothyroidism, unspecified: Secondary | ICD-10-CM | POA: Diagnosis not present

## 2019-05-16 DIAGNOSIS — Z96641 Presence of right artificial hip joint: Secondary | ICD-10-CM | POA: Diagnosis not present

## 2019-05-16 DIAGNOSIS — Z8673 Personal history of transient ischemic attack (TIA), and cerebral infarction without residual deficits: Secondary | ICD-10-CM | POA: Diagnosis not present

## 2019-05-16 DIAGNOSIS — M1652 Unilateral post-traumatic osteoarthritis, left hip: Secondary | ICD-10-CM | POA: Diagnosis not present

## 2019-05-16 DIAGNOSIS — R32 Unspecified urinary incontinence: Secondary | ICD-10-CM | POA: Diagnosis not present

## 2019-05-16 DIAGNOSIS — Z9181 History of falling: Secondary | ICD-10-CM | POA: Diagnosis not present

## 2019-05-16 DIAGNOSIS — Z7982 Long term (current) use of aspirin: Secondary | ICD-10-CM | POA: Diagnosis not present

## 2019-05-16 DIAGNOSIS — J449 Chronic obstructive pulmonary disease, unspecified: Secondary | ICD-10-CM | POA: Diagnosis not present

## 2019-05-16 DIAGNOSIS — N401 Enlarged prostate with lower urinary tract symptoms: Secondary | ICD-10-CM | POA: Diagnosis not present

## 2019-05-16 DIAGNOSIS — E785 Hyperlipidemia, unspecified: Secondary | ICD-10-CM | POA: Diagnosis not present

## 2019-05-16 DIAGNOSIS — Z7951 Long term (current) use of inhaled steroids: Secondary | ICD-10-CM | POA: Diagnosis not present

## 2019-05-16 DIAGNOSIS — H409 Unspecified glaucoma: Secondary | ICD-10-CM | POA: Diagnosis not present

## 2019-05-16 DIAGNOSIS — M25551 Pain in right hip: Secondary | ICD-10-CM | POA: Diagnosis not present

## 2019-05-16 DIAGNOSIS — E1142 Type 2 diabetes mellitus with diabetic polyneuropathy: Secondary | ICD-10-CM | POA: Diagnosis not present

## 2019-05-16 DIAGNOSIS — I10 Essential (primary) hypertension: Secondary | ICD-10-CM | POA: Diagnosis not present

## 2019-05-16 DIAGNOSIS — Z7984 Long term (current) use of oral hypoglycemic drugs: Secondary | ICD-10-CM | POA: Diagnosis not present

## 2019-05-16 DIAGNOSIS — H269 Unspecified cataract: Secondary | ICD-10-CM | POA: Diagnosis not present

## 2019-05-16 DIAGNOSIS — H53009 Unspecified amblyopia, unspecified eye: Secondary | ICD-10-CM | POA: Diagnosis not present

## 2019-05-16 DIAGNOSIS — H34231 Retinal artery branch occlusion, right eye: Secondary | ICD-10-CM | POA: Diagnosis not present

## 2019-05-20 DIAGNOSIS — R32 Unspecified urinary incontinence: Secondary | ICD-10-CM | POA: Diagnosis not present

## 2019-05-20 DIAGNOSIS — H409 Unspecified glaucoma: Secondary | ICD-10-CM | POA: Diagnosis not present

## 2019-05-20 DIAGNOSIS — Z8673 Personal history of transient ischemic attack (TIA), and cerebral infarction without residual deficits: Secondary | ICD-10-CM | POA: Diagnosis not present

## 2019-05-20 DIAGNOSIS — Z9181 History of falling: Secondary | ICD-10-CM | POA: Diagnosis not present

## 2019-05-20 DIAGNOSIS — Z7951 Long term (current) use of inhaled steroids: Secondary | ICD-10-CM | POA: Diagnosis not present

## 2019-05-20 DIAGNOSIS — I10 Essential (primary) hypertension: Secondary | ICD-10-CM | POA: Diagnosis not present

## 2019-05-20 DIAGNOSIS — H269 Unspecified cataract: Secondary | ICD-10-CM | POA: Diagnosis not present

## 2019-05-20 DIAGNOSIS — H53009 Unspecified amblyopia, unspecified eye: Secondary | ICD-10-CM | POA: Diagnosis not present

## 2019-05-20 DIAGNOSIS — E1142 Type 2 diabetes mellitus with diabetic polyneuropathy: Secondary | ICD-10-CM | POA: Diagnosis not present

## 2019-05-20 DIAGNOSIS — Z7982 Long term (current) use of aspirin: Secondary | ICD-10-CM | POA: Diagnosis not present

## 2019-05-20 DIAGNOSIS — E039 Hypothyroidism, unspecified: Secondary | ICD-10-CM | POA: Diagnosis not present

## 2019-05-20 DIAGNOSIS — J449 Chronic obstructive pulmonary disease, unspecified: Secondary | ICD-10-CM | POA: Diagnosis not present

## 2019-05-20 DIAGNOSIS — Z7984 Long term (current) use of oral hypoglycemic drugs: Secondary | ICD-10-CM | POA: Diagnosis not present

## 2019-05-20 DIAGNOSIS — N401 Enlarged prostate with lower urinary tract symptoms: Secondary | ICD-10-CM | POA: Diagnosis not present

## 2019-05-20 DIAGNOSIS — M25551 Pain in right hip: Secondary | ICD-10-CM | POA: Diagnosis not present

## 2019-05-20 DIAGNOSIS — E785 Hyperlipidemia, unspecified: Secondary | ICD-10-CM | POA: Diagnosis not present

## 2019-05-20 DIAGNOSIS — Z96641 Presence of right artificial hip joint: Secondary | ICD-10-CM | POA: Diagnosis not present

## 2019-05-20 DIAGNOSIS — M1652 Unilateral post-traumatic osteoarthritis, left hip: Secondary | ICD-10-CM | POA: Diagnosis not present

## 2019-05-20 DIAGNOSIS — H34231 Retinal artery branch occlusion, right eye: Secondary | ICD-10-CM | POA: Diagnosis not present

## 2019-05-22 DIAGNOSIS — Z9181 History of falling: Secondary | ICD-10-CM | POA: Diagnosis not present

## 2019-05-22 DIAGNOSIS — R531 Weakness: Secondary | ICD-10-CM | POA: Diagnosis not present

## 2019-05-27 DIAGNOSIS — H524 Presbyopia: Secondary | ICD-10-CM | POA: Diagnosis not present

## 2019-05-27 DIAGNOSIS — H5213 Myopia, bilateral: Secondary | ICD-10-CM | POA: Diagnosis not present

## 2019-05-27 DIAGNOSIS — Z7984 Long term (current) use of oral hypoglycemic drugs: Secondary | ICD-10-CM | POA: Diagnosis not present

## 2019-05-27 DIAGNOSIS — H401122 Primary open-angle glaucoma, left eye, moderate stage: Secondary | ICD-10-CM | POA: Diagnosis not present

## 2019-05-27 DIAGNOSIS — H52203 Unspecified astigmatism, bilateral: Secondary | ICD-10-CM | POA: Diagnosis not present

## 2019-05-27 DIAGNOSIS — H401113 Primary open-angle glaucoma, right eye, severe stage: Secondary | ICD-10-CM | POA: Diagnosis not present

## 2019-05-27 DIAGNOSIS — E119 Type 2 diabetes mellitus without complications: Secondary | ICD-10-CM | POA: Diagnosis not present

## 2019-05-27 DIAGNOSIS — H2513 Age-related nuclear cataract, bilateral: Secondary | ICD-10-CM | POA: Diagnosis not present

## 2019-05-27 DIAGNOSIS — H25012 Cortical age-related cataract, left eye: Secondary | ICD-10-CM | POA: Diagnosis not present

## 2019-05-27 DIAGNOSIS — H25042 Posterior subcapsular polar age-related cataract, left eye: Secondary | ICD-10-CM | POA: Diagnosis not present

## 2019-06-14 DIAGNOSIS — Z23 Encounter for immunization: Secondary | ICD-10-CM | POA: Diagnosis not present

## 2019-06-19 IMAGING — CR DG HIP (WITH OR WITHOUT PELVIS) 2-3V*R*
3 series · 3 of 3 positions shown · non-contrast
Comparison: None.

CLINICAL DATA: Generalized right hip pain after a fall.

EXAM:
DG HIP (WITH OR WITHOUT PELVIS) 2-3V RIGHT

[x pelvis]
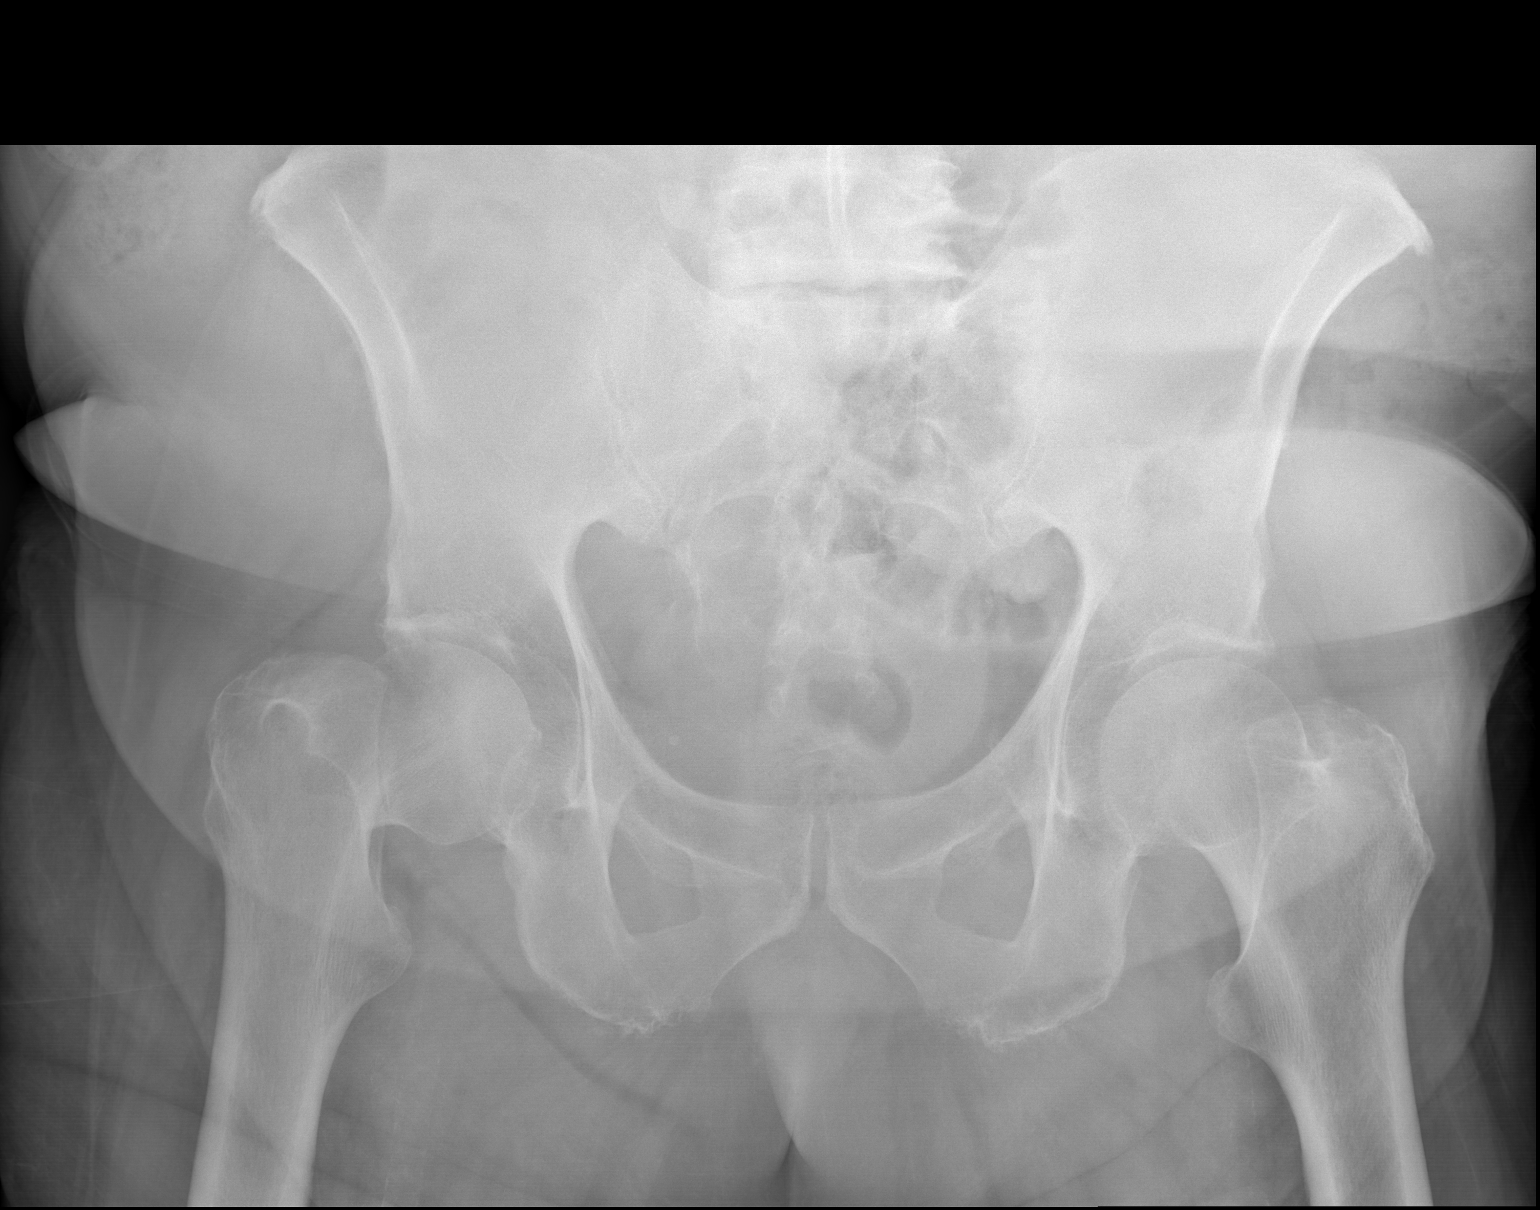

[x hip ap right]
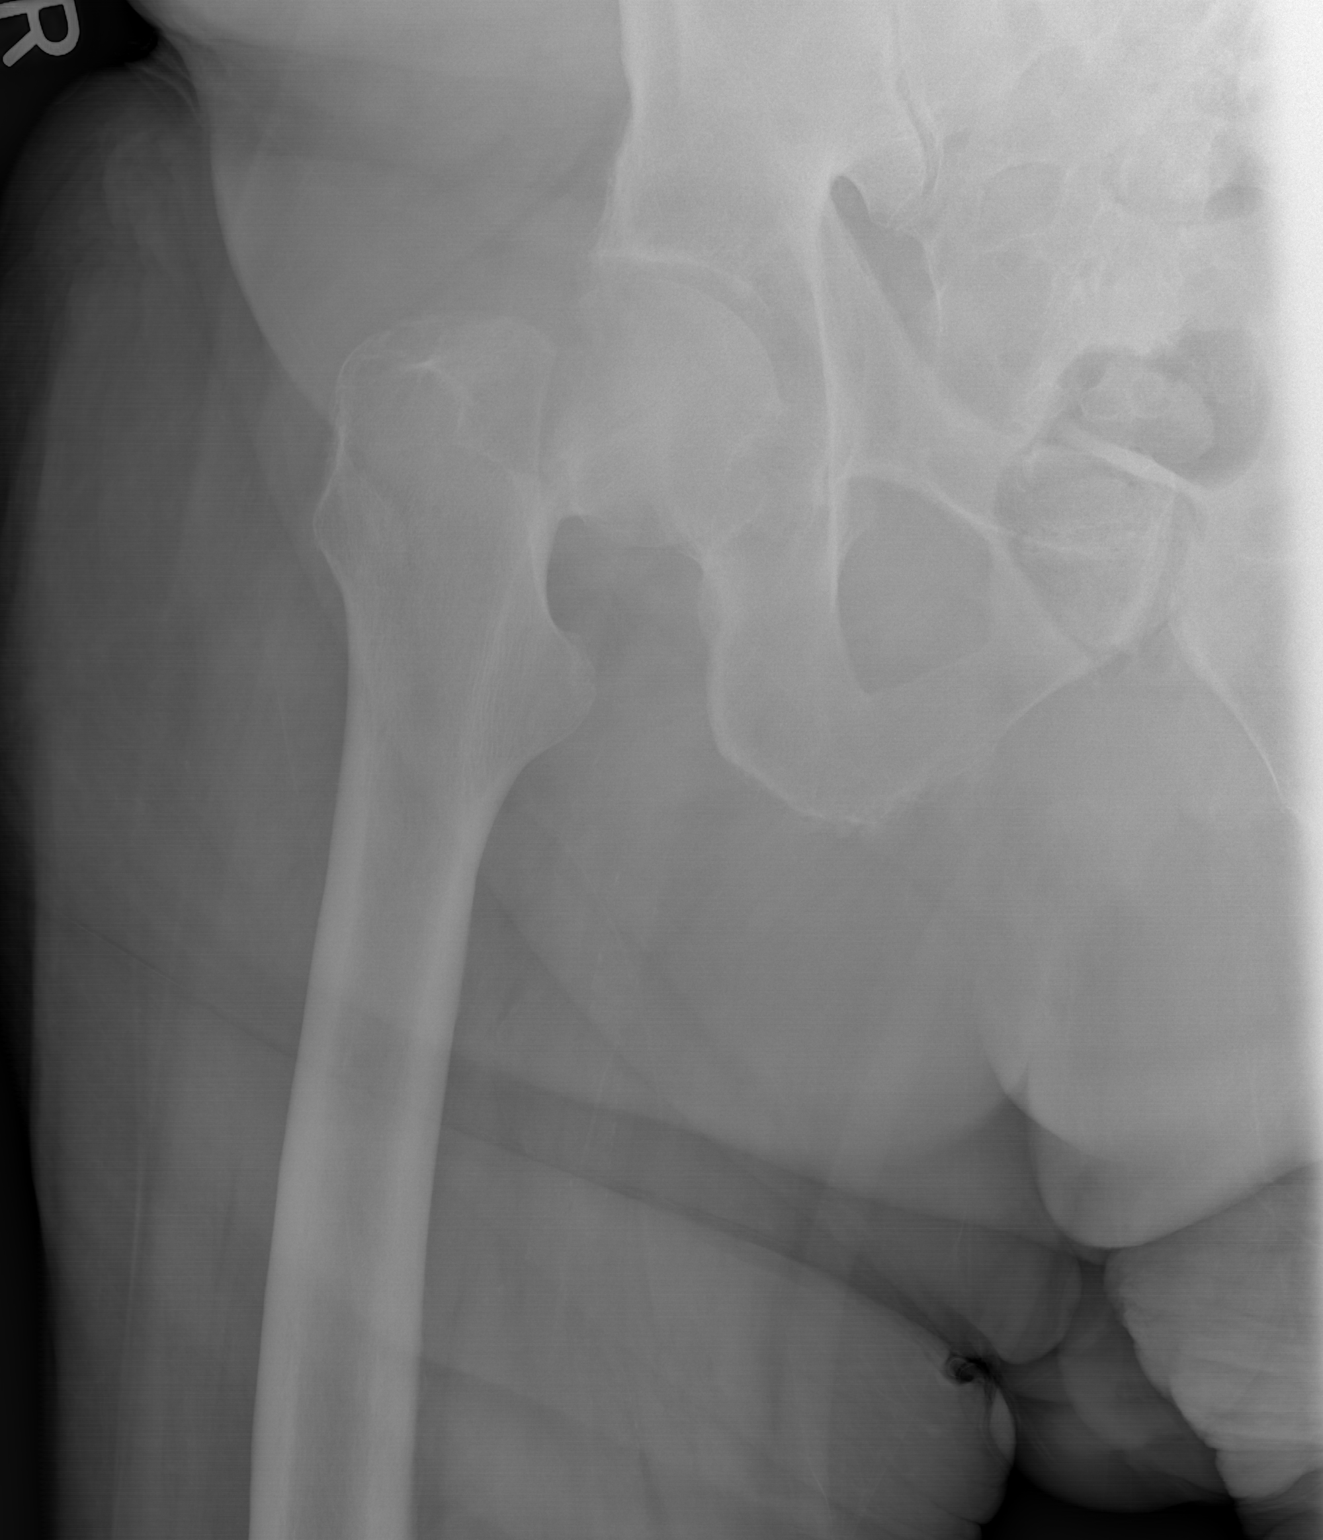

[w hip lat right]
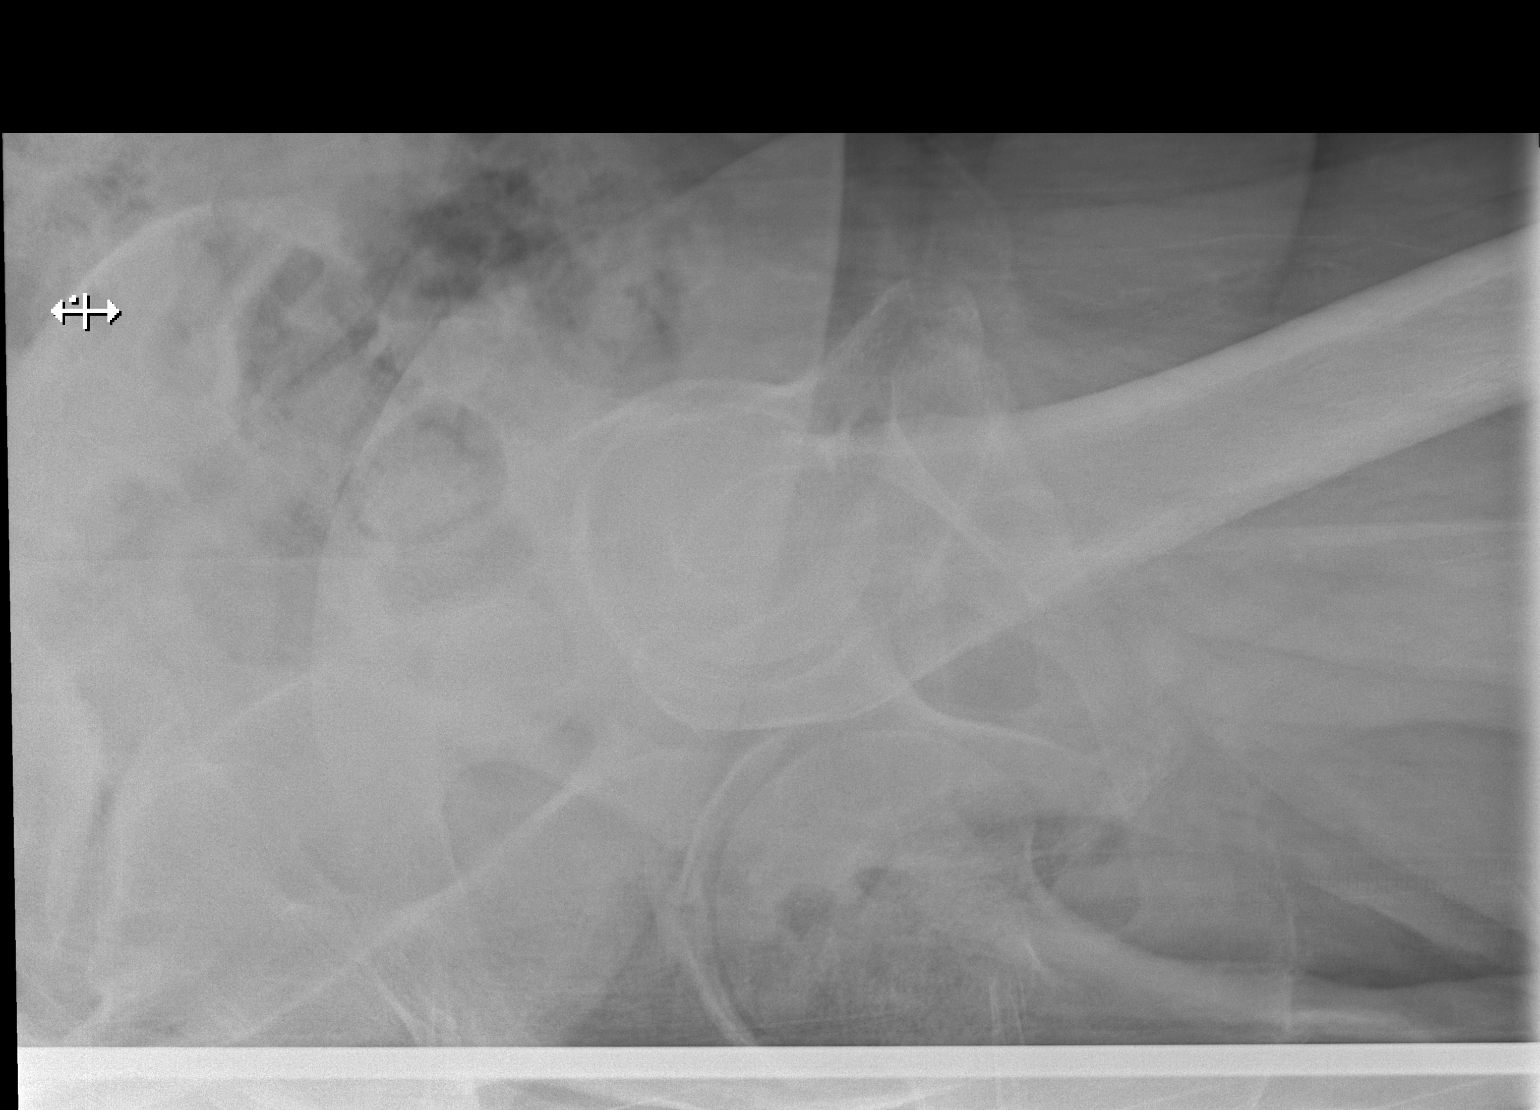

[3 of 3 positions shown; findings below may reference images not displayed]

FINDINGS: Transverse fracture of the right femoral neck with varus angulation
of the fracture fragments. No dislocation at the hip joint. Pelvis
appears intact. Mild degenerative changes in the lower lumbar spine
and both hips.
IMPRESSION: Acute transverse fracture of the right femoral neck with varus
angulation.

## 2019-06-21 IMAGING — DX DG PELVIS 1-2V
2 series · 2 of 2 positions shown · non-contrast
Comparison: None.

CLINICAL DATA: Status post right hip replacement October 12, 2017.

EXAM:
PELVIS - 1-2 VIEW

[pelvis ap (1 of 2)]
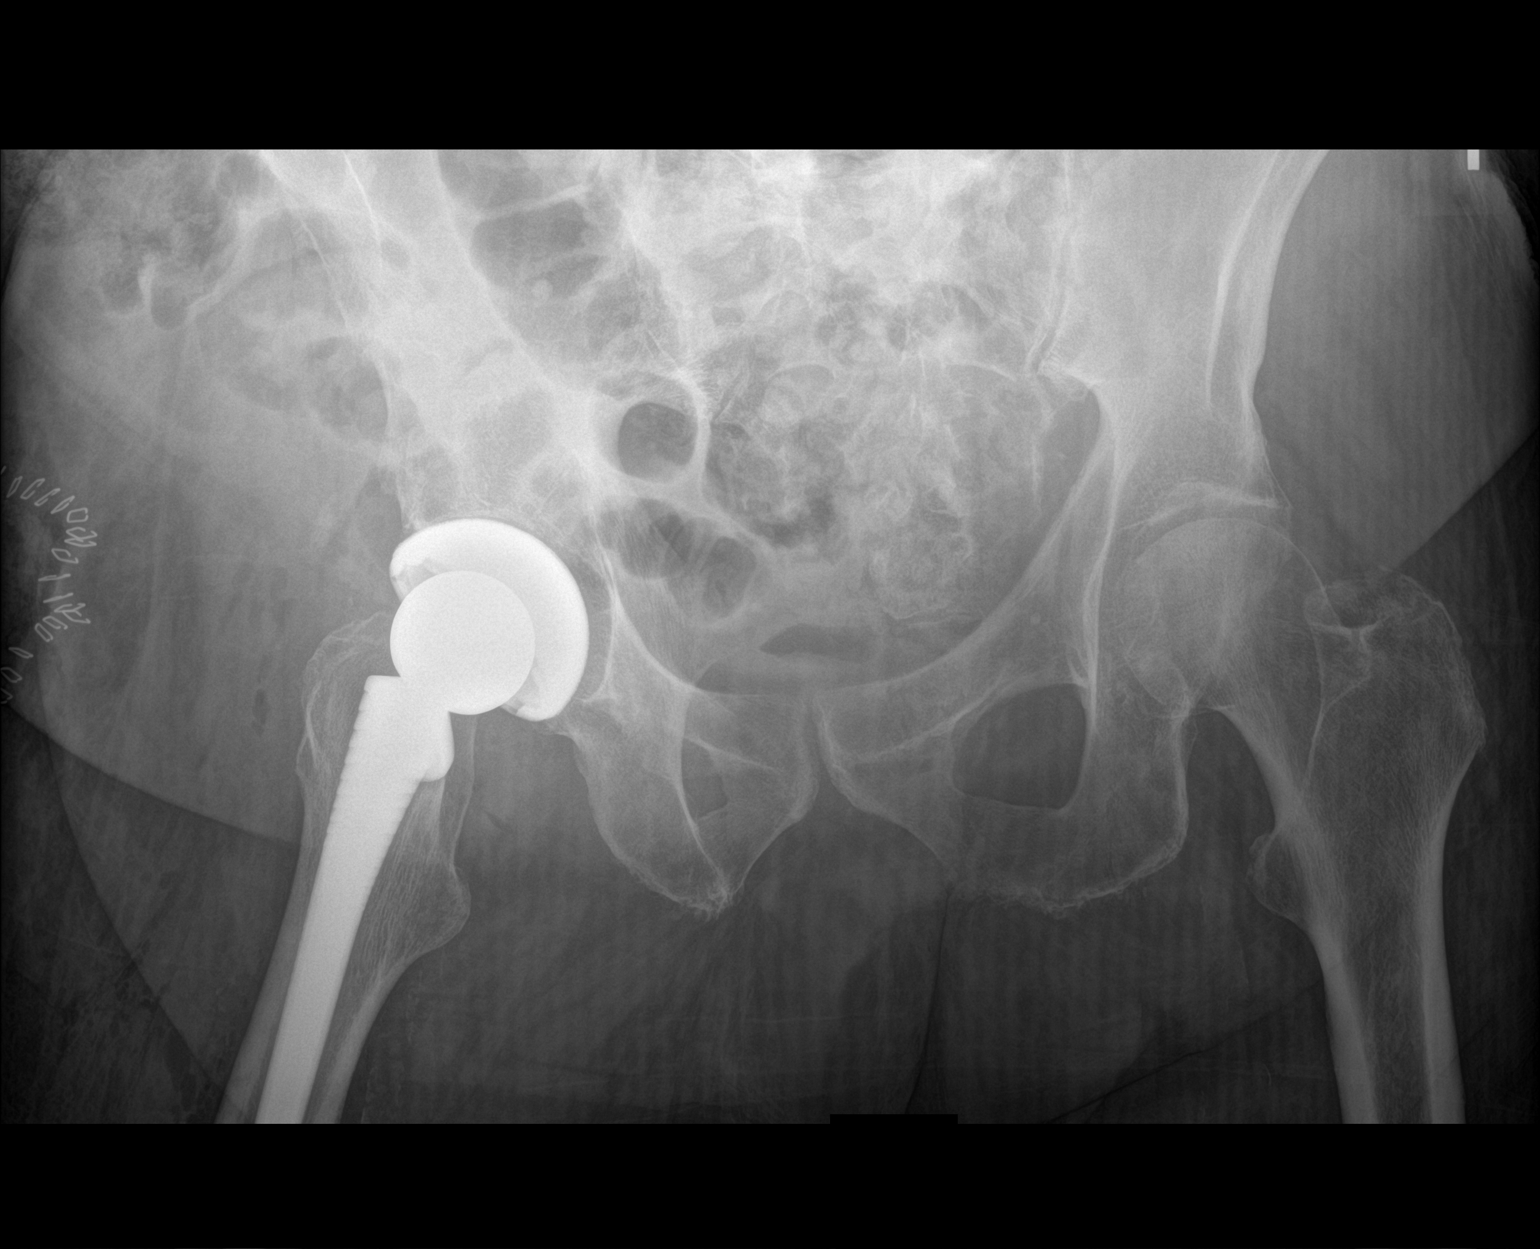

[pelvis ap (2 of 2)]
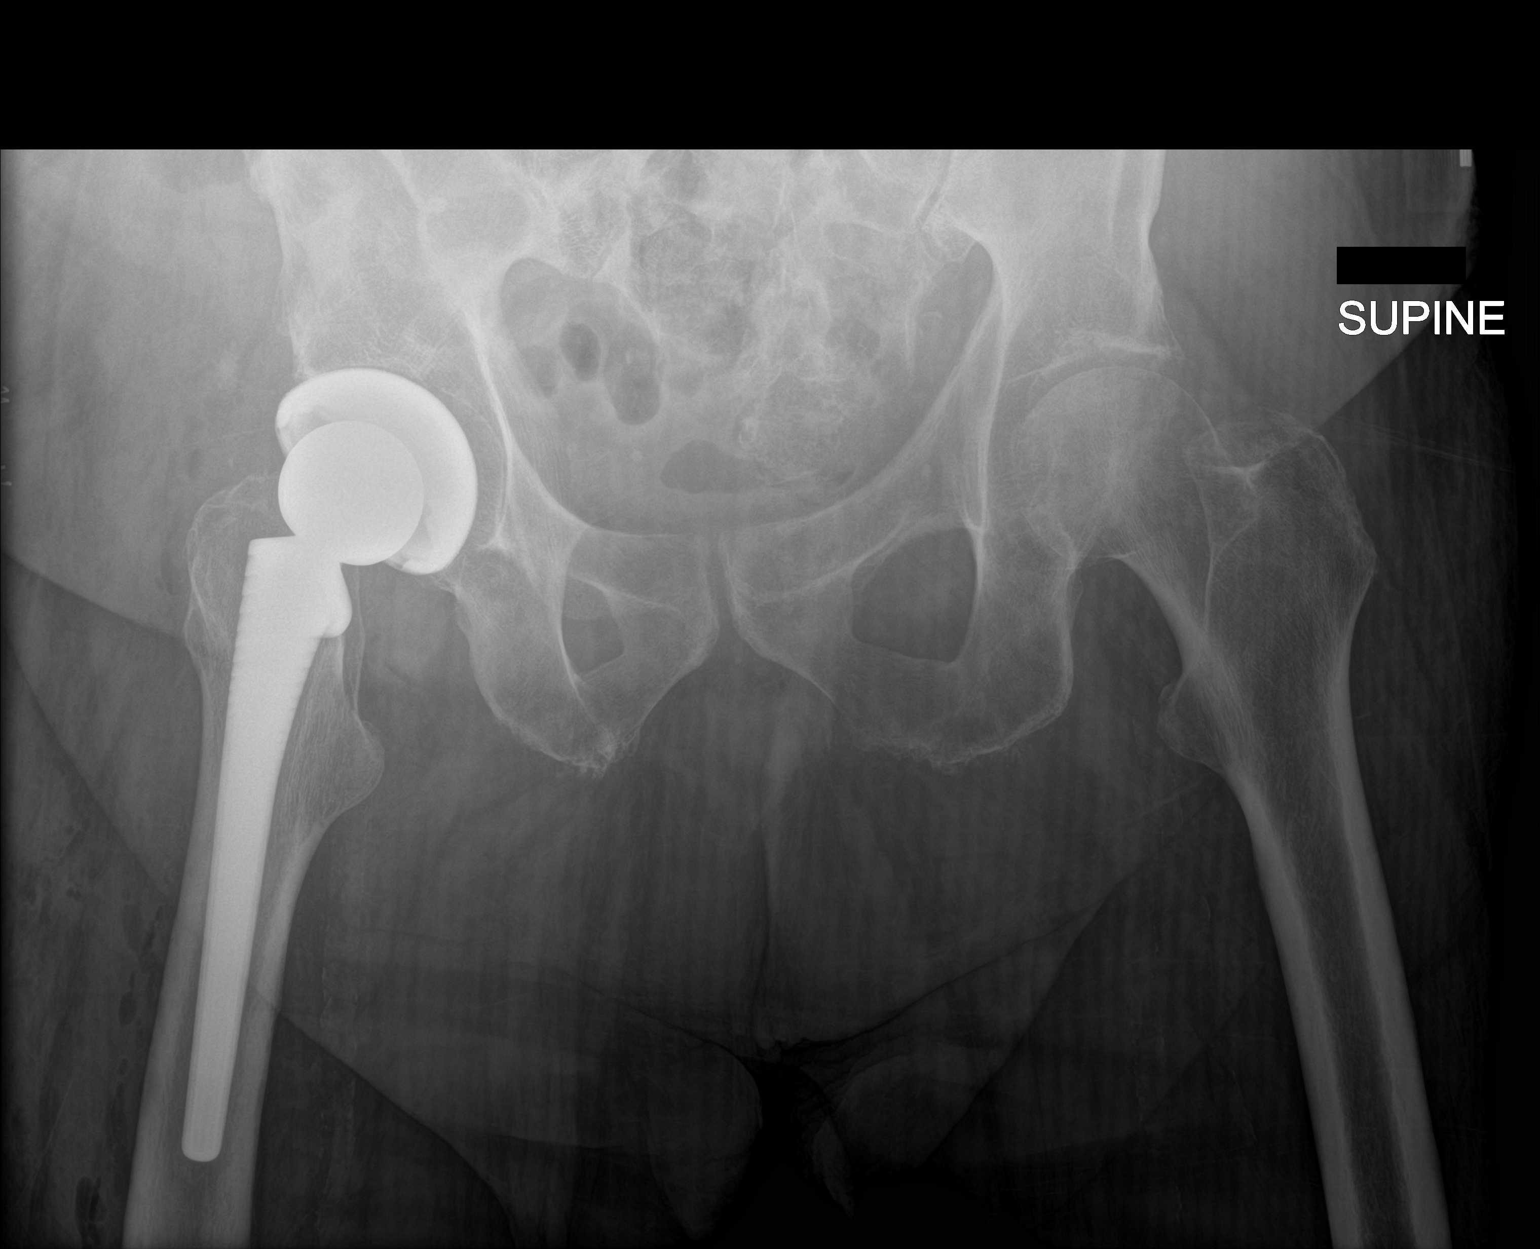

[2 of 2 positions shown; findings below may reference images not displayed]

FINDINGS: Patient is status post right hip replacement. Acetabular and femoral
components are in good position. Soft tissue gas and skin staples
consistent with interval surgery. No other acute abnormalities.
IMPRESSION: Postoperative changes in the right hip.

## 2019-07-18 DIAGNOSIS — E119 Type 2 diabetes mellitus without complications: Secondary | ICD-10-CM | POA: Diagnosis not present

## 2019-07-18 DIAGNOSIS — E1169 Type 2 diabetes mellitus with other specified complication: Secondary | ICD-10-CM | POA: Diagnosis not present

## 2019-07-18 DIAGNOSIS — E039 Hypothyroidism, unspecified: Secondary | ICD-10-CM | POA: Diagnosis not present

## 2019-07-18 DIAGNOSIS — E785 Hyperlipidemia, unspecified: Secondary | ICD-10-CM | POA: Diagnosis not present

## 2019-07-18 DIAGNOSIS — E559 Vitamin D deficiency, unspecified: Secondary | ICD-10-CM | POA: Diagnosis not present

## 2019-08-10 DIAGNOSIS — Z86010 Personal history of colonic polyps: Secondary | ICD-10-CM | POA: Diagnosis not present

## 2019-08-10 DIAGNOSIS — K625 Hemorrhage of anus and rectum: Secondary | ICD-10-CM | POA: Diagnosis not present

## 2019-08-10 DIAGNOSIS — I10 Essential (primary) hypertension: Secondary | ICD-10-CM | POA: Diagnosis not present

## 2019-08-10 DIAGNOSIS — D122 Benign neoplasm of ascending colon: Secondary | ICD-10-CM | POA: Diagnosis not present

## 2019-08-10 DIAGNOSIS — D120 Benign neoplasm of cecum: Secondary | ICD-10-CM | POA: Diagnosis not present

## 2019-08-10 DIAGNOSIS — E039 Hypothyroidism, unspecified: Secondary | ICD-10-CM | POA: Diagnosis not present

## 2019-08-10 DIAGNOSIS — N400 Enlarged prostate without lower urinary tract symptoms: Secondary | ICD-10-CM | POA: Diagnosis not present

## 2019-08-10 DIAGNOSIS — Z79899 Other long term (current) drug therapy: Secondary | ICD-10-CM | POA: Diagnosis not present

## 2019-08-10 DIAGNOSIS — E119 Type 2 diabetes mellitus without complications: Secondary | ICD-10-CM | POA: Diagnosis not present

## 2019-08-10 DIAGNOSIS — K5730 Diverticulosis of large intestine without perforation or abscess without bleeding: Secondary | ICD-10-CM | POA: Diagnosis not present

## 2019-08-10 DIAGNOSIS — E785 Hyperlipidemia, unspecified: Secondary | ICD-10-CM | POA: Diagnosis not present

## 2019-08-10 DIAGNOSIS — J449 Chronic obstructive pulmonary disease, unspecified: Secondary | ICD-10-CM | POA: Diagnosis not present

## 2019-08-10 DIAGNOSIS — G7000 Myasthenia gravis without (acute) exacerbation: Secondary | ICD-10-CM | POA: Diagnosis not present

## 2019-08-10 DIAGNOSIS — H401132 Primary open-angle glaucoma, bilateral, moderate stage: Secondary | ICD-10-CM | POA: Diagnosis not present

## 2019-08-10 DIAGNOSIS — Z87891 Personal history of nicotine dependence: Secondary | ICD-10-CM | POA: Diagnosis not present

## 2019-08-10 DIAGNOSIS — K648 Other hemorrhoids: Secondary | ICD-10-CM | POA: Diagnosis not present

## 2019-08-11 DIAGNOSIS — E785 Hyperlipidemia, unspecified: Secondary | ICD-10-CM | POA: Diagnosis not present

## 2019-08-11 DIAGNOSIS — N400 Enlarged prostate without lower urinary tract symptoms: Secondary | ICD-10-CM | POA: Diagnosis not present

## 2019-08-11 DIAGNOSIS — Z86010 Personal history of colonic polyps: Secondary | ICD-10-CM | POA: Diagnosis not present

## 2019-08-11 DIAGNOSIS — H401132 Primary open-angle glaucoma, bilateral, moderate stage: Secondary | ICD-10-CM | POA: Diagnosis not present

## 2019-08-11 DIAGNOSIS — K625 Hemorrhage of anus and rectum: Secondary | ICD-10-CM | POA: Diagnosis not present

## 2019-08-11 DIAGNOSIS — E119 Type 2 diabetes mellitus without complications: Secondary | ICD-10-CM | POA: Diagnosis not present

## 2019-08-11 DIAGNOSIS — E039 Hypothyroidism, unspecified: Secondary | ICD-10-CM | POA: Diagnosis not present

## 2019-08-11 DIAGNOSIS — R195 Other fecal abnormalities: Secondary | ICD-10-CM | POA: Diagnosis not present

## 2019-08-12 DIAGNOSIS — N401 Enlarged prostate with lower urinary tract symptoms: Secondary | ICD-10-CM | POA: Diagnosis not present

## 2019-08-12 DIAGNOSIS — D122 Benign neoplasm of ascending colon: Secondary | ICD-10-CM | POA: Diagnosis not present

## 2019-08-12 DIAGNOSIS — K625 Hemorrhage of anus and rectum: Secondary | ICD-10-CM | POA: Diagnosis not present

## 2019-08-12 DIAGNOSIS — E1169 Type 2 diabetes mellitus with other specified complication: Secondary | ICD-10-CM | POA: Diagnosis not present

## 2019-08-12 DIAGNOSIS — K921 Melena: Secondary | ICD-10-CM | POA: Diagnosis not present

## 2019-08-12 DIAGNOSIS — D120 Benign neoplasm of cecum: Secondary | ICD-10-CM | POA: Diagnosis not present

## 2019-08-12 DIAGNOSIS — E785 Hyperlipidemia, unspecified: Secondary | ICD-10-CM | POA: Diagnosis not present

## 2019-08-12 DIAGNOSIS — K648 Other hemorrhoids: Secondary | ICD-10-CM | POA: Diagnosis not present

## 2019-08-12 DIAGNOSIS — K635 Polyp of colon: Secondary | ICD-10-CM | POA: Diagnosis not present

## 2019-08-12 DIAGNOSIS — K5730 Diverticulosis of large intestine without perforation or abscess without bleeding: Secondary | ICD-10-CM | POA: Diagnosis not present

## 2019-08-15 DIAGNOSIS — Z09 Encounter for follow-up examination after completed treatment for conditions other than malignant neoplasm: Secondary | ICD-10-CM | POA: Diagnosis not present

## 2019-08-15 DIAGNOSIS — K625 Hemorrhage of anus and rectum: Secondary | ICD-10-CM | POA: Diagnosis not present

## 2019-08-15 DIAGNOSIS — Z8719 Personal history of other diseases of the digestive system: Secondary | ICD-10-CM | POA: Diagnosis not present

## 2019-09-08 DIAGNOSIS — K5731 Diverticulosis of large intestine without perforation or abscess with bleeding: Secondary | ICD-10-CM | POA: Diagnosis not present

## 2019-09-22 DIAGNOSIS — E785 Hyperlipidemia, unspecified: Secondary | ICD-10-CM | POA: Diagnosis not present

## 2019-09-22 DIAGNOSIS — E119 Type 2 diabetes mellitus without complications: Secondary | ICD-10-CM | POA: Diagnosis not present

## 2019-09-22 DIAGNOSIS — E1169 Type 2 diabetes mellitus with other specified complication: Secondary | ICD-10-CM | POA: Diagnosis not present

## 2019-09-22 DIAGNOSIS — E1165 Type 2 diabetes mellitus with hyperglycemia: Secondary | ICD-10-CM | POA: Diagnosis not present

## 2019-09-22 DIAGNOSIS — E039 Hypothyroidism, unspecified: Secondary | ICD-10-CM | POA: Diagnosis not present

## 2019-09-22 DIAGNOSIS — D692 Other nonthrombocytopenic purpura: Secondary | ICD-10-CM | POA: Diagnosis not present

## 2019-09-30 DIAGNOSIS — H401113 Primary open-angle glaucoma, right eye, severe stage: Secondary | ICD-10-CM | POA: Diagnosis not present

## 2019-09-30 DIAGNOSIS — H0288B Meibomian gland dysfunction left eye, upper and lower eyelids: Secondary | ICD-10-CM | POA: Diagnosis not present

## 2019-09-30 DIAGNOSIS — H0288A Meibomian gland dysfunction right eye, upper and lower eyelids: Secondary | ICD-10-CM | POA: Diagnosis not present

## 2019-09-30 DIAGNOSIS — H401122 Primary open-angle glaucoma, left eye, moderate stage: Secondary | ICD-10-CM | POA: Diagnosis not present
